# Patient Record
Sex: Female | Born: 1980 | Race: White | Hispanic: No | Marital: Married | Smoking: Former smoker
Health system: Southern US, Community
[De-identification: ages and names within clinical notes are randomized; demographics above are authoritative.]

## PROBLEM LIST (undated history)

## (undated) DIAGNOSIS — N289 Disorder of kidney and ureter, unspecified: Secondary | ICD-10-CM

## (undated) DIAGNOSIS — N200 Calculus of kidney: Secondary | ICD-10-CM

## (undated) DIAGNOSIS — F419 Anxiety disorder, unspecified: Secondary | ICD-10-CM

## (undated) DIAGNOSIS — Z87440 Personal history of urinary (tract) infections: Secondary | ICD-10-CM

## (undated) DIAGNOSIS — G43909 Migraine, unspecified, not intractable, without status migrainosus: Secondary | ICD-10-CM

## (undated) HISTORY — PX: NEPHROSTOMY: SHX1014

## (undated) HISTORY — DX: Calculus of kidney: N20.0

## (undated) HISTORY — DX: Migraine, unspecified, not intractable, without status migrainosus: G43.909

---
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
## 1999-08-05 ENCOUNTER — Emergency Department (HOSPITAL_COMMUNITY): Admission: EM | Admit: 1999-08-05 | Discharge: 1999-08-05 | Payer: Self-pay | Admitting: Emergency Medicine

## 1999-08-05 ENCOUNTER — Encounter: Payer: Self-pay | Admitting: Emergency Medicine

## 2002-11-27 ENCOUNTER — Emergency Department (HOSPITAL_COMMUNITY): Admission: EM | Admit: 2002-11-27 | Discharge: 2002-11-27 | Payer: Self-pay | Admitting: Emergency Medicine

## 2002-12-26 ENCOUNTER — Emergency Department (HOSPITAL_COMMUNITY): Admission: EM | Admit: 2002-12-26 | Discharge: 2002-12-26 | Payer: Self-pay

## 2003-06-20 ENCOUNTER — Emergency Department (HOSPITAL_COMMUNITY): Admission: EM | Admit: 2003-06-20 | Discharge: 2003-06-20 | Payer: Self-pay | Admitting: Emergency Medicine

## 2003-06-20 ENCOUNTER — Encounter: Payer: Self-pay | Admitting: Emergency Medicine

## 2003-12-13 ENCOUNTER — Other Ambulatory Visit: Admission: RE | Admit: 2003-12-13 | Discharge: 2003-12-13 | Payer: Self-pay | Admitting: Obstetrics and Gynecology

## 2005-01-22 ENCOUNTER — Other Ambulatory Visit: Admission: RE | Admit: 2005-01-22 | Discharge: 2005-01-22 | Payer: Self-pay | Admitting: Obstetrics and Gynecology

## 2005-05-21 ENCOUNTER — Inpatient Hospital Stay (HOSPITAL_COMMUNITY): Admission: AD | Admit: 2005-05-21 | Discharge: 2005-05-21 | Payer: Self-pay | Admitting: Obstetrics and Gynecology

## 2005-05-22 ENCOUNTER — Inpatient Hospital Stay (HOSPITAL_COMMUNITY): Admission: AD | Admit: 2005-05-22 | Discharge: 2005-05-22 | Payer: Self-pay | Admitting: Obstetrics and Gynecology

## 2005-07-11 ENCOUNTER — Inpatient Hospital Stay (HOSPITAL_COMMUNITY): Admission: AD | Admit: 2005-07-11 | Discharge: 2005-07-12 | Payer: Self-pay | Admitting: Obstetrics & Gynecology

## 2005-07-15 ENCOUNTER — Inpatient Hospital Stay (HOSPITAL_COMMUNITY): Admission: AD | Admit: 2005-07-15 | Discharge: 2005-07-15 | Payer: Self-pay | Admitting: Obstetrics and Gynecology

## 2005-07-24 ENCOUNTER — Inpatient Hospital Stay (HOSPITAL_COMMUNITY): Admission: AD | Admit: 2005-07-24 | Discharge: 2005-07-26 | Payer: Self-pay | Admitting: Obstetrics and Gynecology

## 2005-07-27 ENCOUNTER — Encounter: Admission: RE | Admit: 2005-07-27 | Discharge: 2005-08-06 | Payer: Self-pay | Admitting: Obstetrics and Gynecology

## 2007-11-26 ENCOUNTER — Inpatient Hospital Stay (HOSPITAL_COMMUNITY): Admission: AD | Admit: 2007-11-26 | Discharge: 2007-11-26 | Payer: Self-pay | Admitting: Obstetrics and Gynecology

## 2007-12-31 ENCOUNTER — Ambulatory Visit (HOSPITAL_COMMUNITY): Admission: RE | Admit: 2007-12-31 | Discharge: 2008-01-01 | Payer: Self-pay | Admitting: Urology

## 2007-12-31 ENCOUNTER — Encounter: Payer: Self-pay | Admitting: Urology

## 2008-01-11 ENCOUNTER — Inpatient Hospital Stay (HOSPITAL_COMMUNITY): Admission: AD | Admit: 2008-01-11 | Discharge: 2008-01-11 | Payer: Self-pay | Admitting: *Deleted

## 2008-01-12 ENCOUNTER — Observation Stay (HOSPITAL_COMMUNITY): Admission: AD | Admit: 2008-01-12 | Discharge: 2008-01-13 | Payer: Self-pay | Admitting: Obstetrics

## 2008-01-28 ENCOUNTER — Ambulatory Visit (HOSPITAL_COMMUNITY): Admission: RE | Admit: 2008-01-28 | Discharge: 2008-01-28 | Payer: Self-pay | Admitting: Urology

## 2008-01-29 ENCOUNTER — Inpatient Hospital Stay (HOSPITAL_COMMUNITY): Admission: AD | Admit: 2008-01-29 | Discharge: 2008-01-29 | Payer: Self-pay | Admitting: Obstetrics & Gynecology

## 2008-02-08 ENCOUNTER — Ambulatory Visit (HOSPITAL_COMMUNITY): Admission: RE | Admit: 2008-02-08 | Discharge: 2008-02-08 | Payer: Self-pay | Admitting: Obstetrics & Gynecology

## 2008-03-03 ENCOUNTER — Ambulatory Visit (HOSPITAL_COMMUNITY): Admission: RE | Admit: 2008-03-03 | Discharge: 2008-03-03 | Payer: Self-pay | Admitting: Interventional Radiology

## 2008-03-12 ENCOUNTER — Inpatient Hospital Stay (HOSPITAL_COMMUNITY): Admission: AD | Admit: 2008-03-12 | Discharge: 2008-03-14 | Payer: Self-pay | Admitting: Obstetrics and Gynecology

## 2008-03-15 ENCOUNTER — Encounter: Admission: RE | Admit: 2008-03-15 | Discharge: 2008-04-01 | Payer: Self-pay | Admitting: Obstetrics and Gynecology

## 2008-03-21 ENCOUNTER — Ambulatory Visit (HOSPITAL_COMMUNITY): Admission: RE | Admit: 2008-03-21 | Discharge: 2008-03-21 | Payer: Self-pay | Admitting: Interventional Radiology

## 2009-04-06 IMAGING — XA IR NEPHROSTOGRAM*R*
1 series · 12 of 24 positions shown · non-contrast
Comparison: none

EXAMINATION:

NEPHROSTOGRAM THROUGH EXISTING RIGHT NEPHROSTOMY CATHETER
FLUOROSCOPIC REMOVAL OF THE RIGHT NEPHROSTOMY CATHETER
CLINICAL DATA: 27-year-old female 9 days postpartum with a chronic
indwelling right nephrostomy during the third trimester for
hydronephrosis and flank pain

[Series 1: run · 12 of 28 slices shown]
[im 2/28]
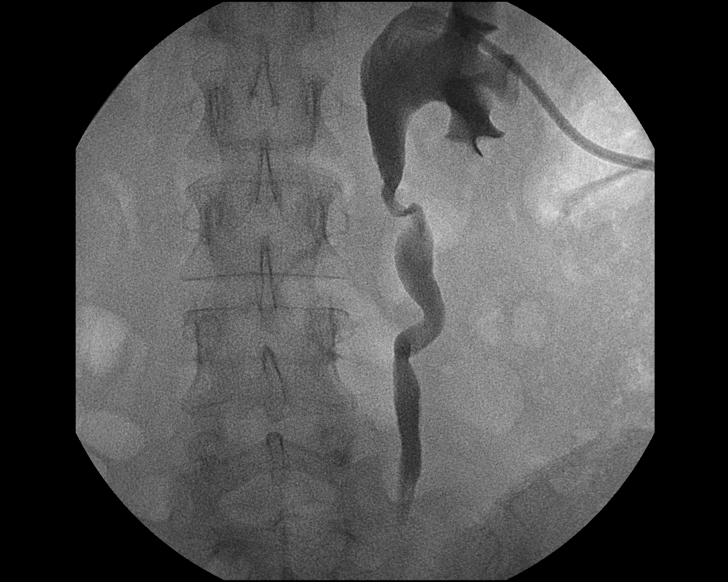
[im 4/28]
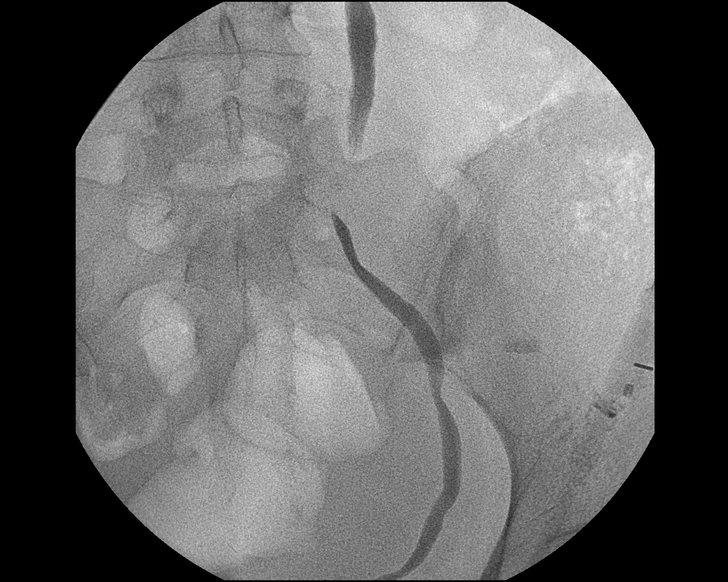
[im 6/28]
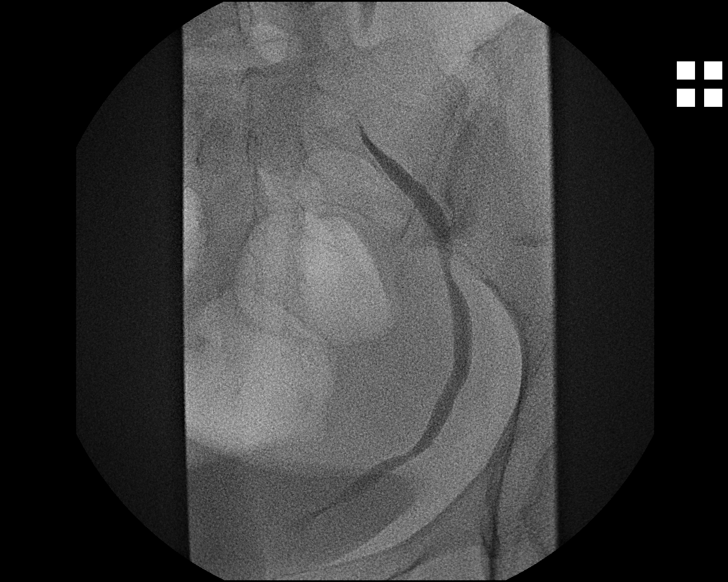
[im 9/28]
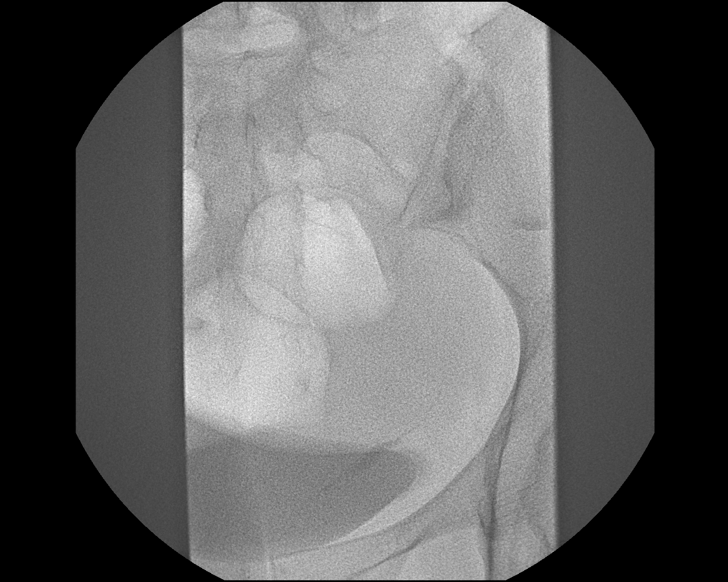
[im 11/28]
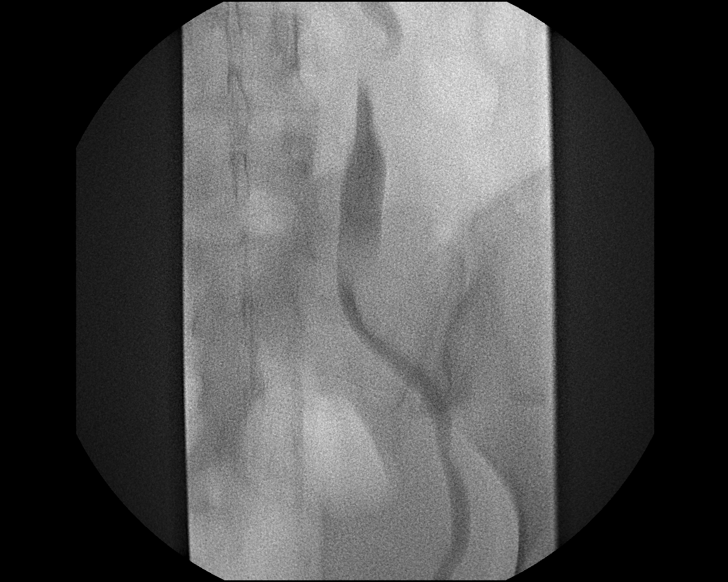
[im 13/28]
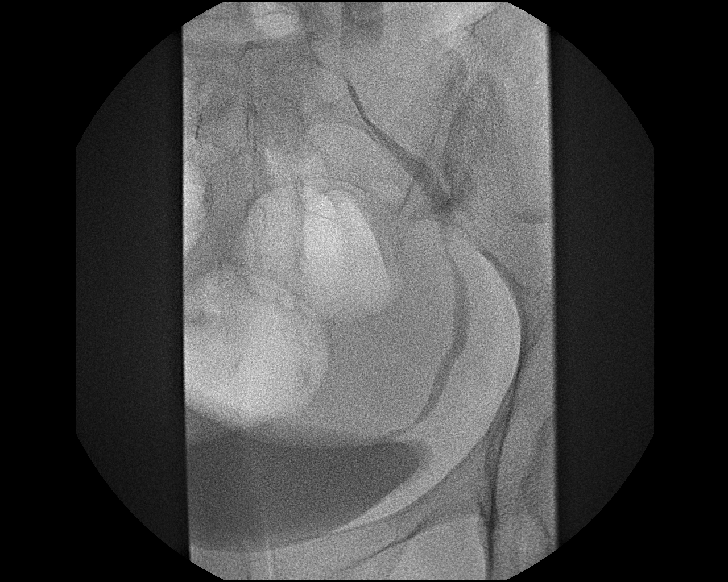
[im 16/28]
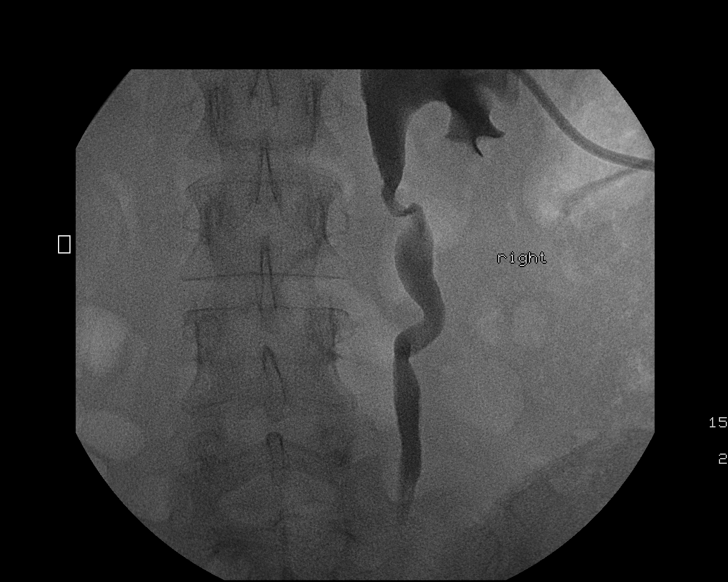
[im 18/28]
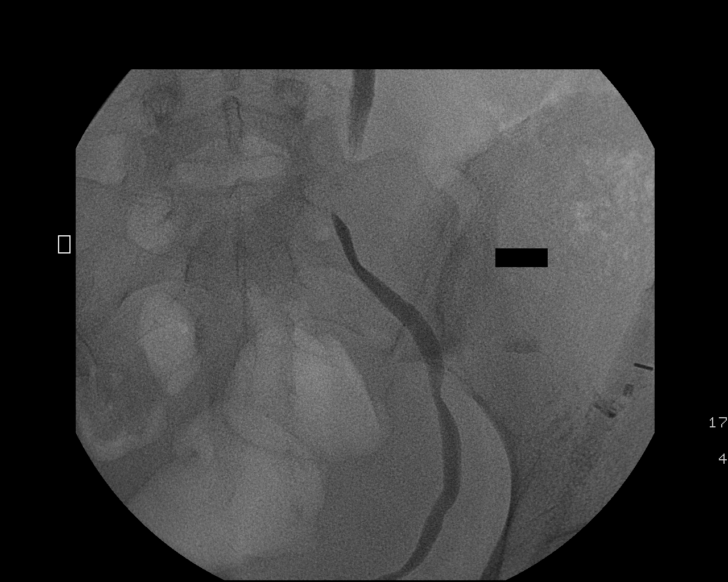
[im 20/28]
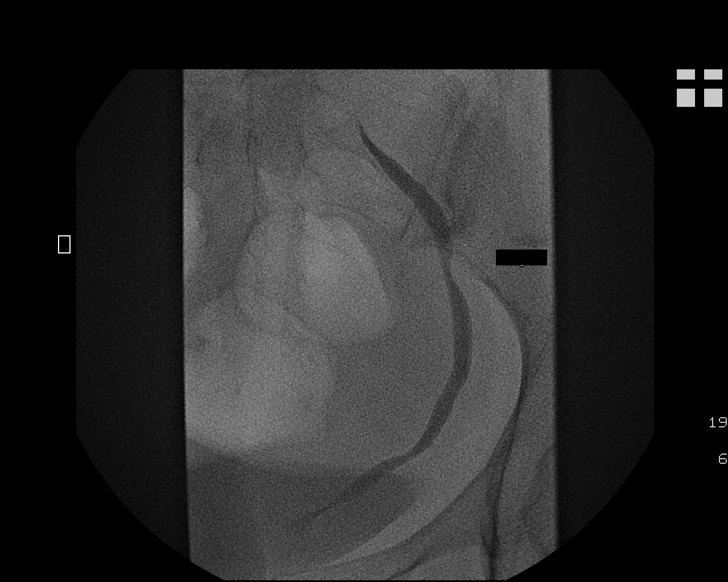
[im 23/28]
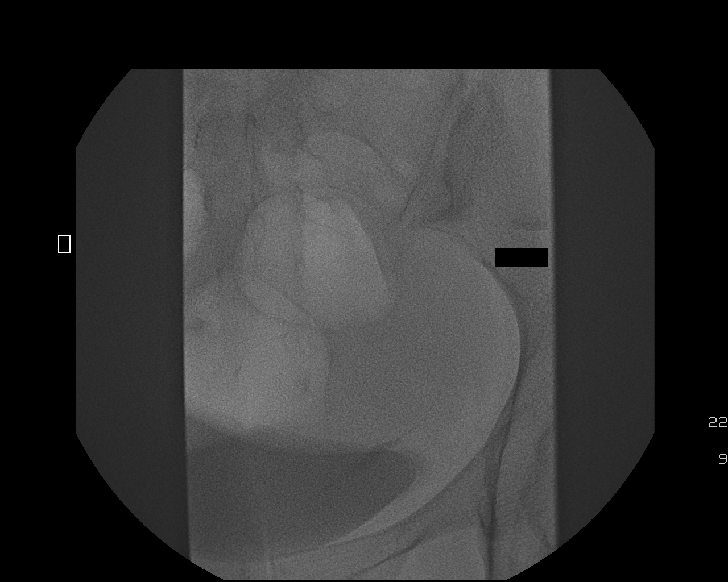
[im 25/28]
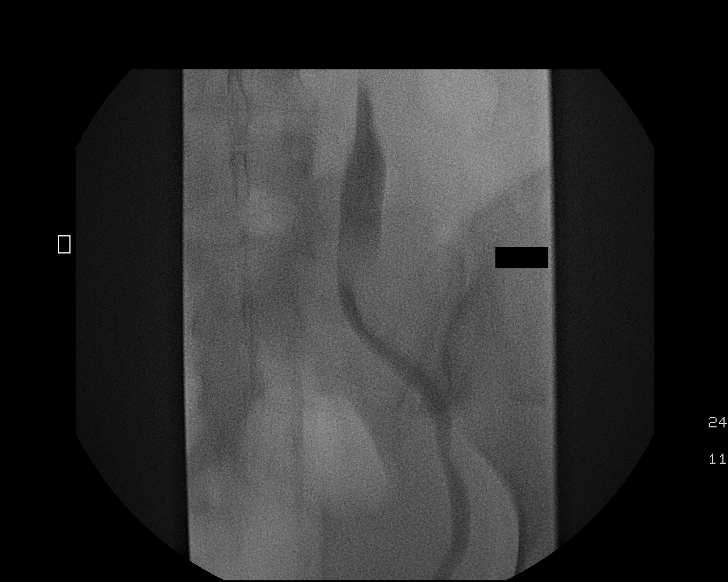
[im 28/28]
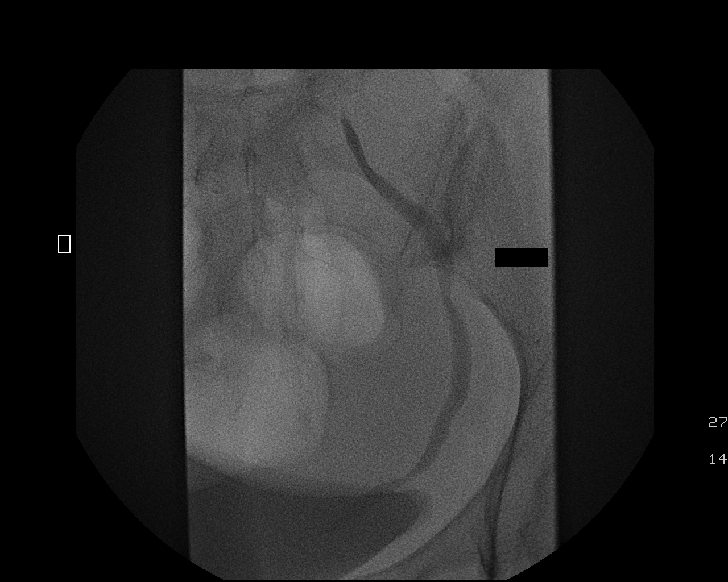

[12 of 24 positions shown; findings below may reference images not displayed]

Date:03/21/2008 [DATE]

Radiologist:GENET.Barral, M.D.

Medications:None.

Guidance:Fluoroscopic

Fluoroscopy time:4 minutes

Sedation time:None.

Contrast volume:30 ml 5mnipaque-GBB

Complications:No immediate

PROCEDURE/FINDINGS:

Informed consent was obtained from the patient following
explanation of the procedure, risks, benefits and alternatives.
The patient understands, agrees and consents for the procedure.
All questions were addressed.  A time out was performed.

Under sterile conditions, the existing nephrostomy catheter was
injected under fluoroscopy for an antegrade nephrostogram.

Nephrostogram:  This demonstrates mild hydronephrosis and proximal
hydroureter.  This extends to the pelvic level.  With further
injection of contrast to distend the collecting system and ureter,
there is passage of contrast into the pelvic ureter which is
nondilated.  Contrast flows freely into the bladder.

With intermittent fluoroscopy,  the right collecting system
continues to demonstrate good spontaneous drainage into the bladder
with the patient supine.  Images were obtained for documentation.
Therefore, the right nephrostomy catheter will be removed.

Right nephrostomy removal:  Under sterile conditions, the right
nephrostomy catheter was removed over a glide wire without
difficulty.  No immediate complication.  The patient tolerated the
procedure well.
IMPRESSION: Antegrade nephrostogram confirms patency of the right
ureter with good drainage into the bladder.

Uncomplicated right nephrostomy removal.

## 2011-01-21 NOTE — Op Note (Signed)
NAMEKHAILEE, MICK                ACCOUNT NO.:  [id number]   MEDICAL RECORD NO.:  [id number]          PATIENT TYPE:  OIB   LOCATION:  9158                          FACILITY:  [facility]   PHYSICIAN:  Valetta Fuller, M.D.  DATE OF BIRTH:  1980/09/18   DATE OF PROCEDURE:  12/31/2007  DATE OF DISCHARGE:                               OPERATIVE REPORT   PREOPERATIVE DIAGNOSIS:  Severe right hydronephrosis with ongoing right  flank discomfort.   POSTOPERATIVE DIAGNOSIS:  Severe right hydronephrosis with ongoing right  flank discomfort.   PROCEDURE PERFORMED:  Cystoscopy with right retrograde pyelogram and  attempted right double-J stent placement.   SURGEON:  Valetta Fuller, M.D.   ANESTHESIA:  IV sedation.   INDICATIONS:  Ms. Trzcinski is currently 25-[redacted] weeks pregnant.  She has been  seen several times over the past 4-6 weeks because of some significant  right flank pain.  She was originally assessed about a month ago.  She  had developed severe right flank discomfort.  CT was ordered by her  OB/GYN, Dr. Maxie Better.  This showed marked/severe  hydroureteronephrosis on the right side.  No evidence of any ureteral  calculus.  This was felt to be secondary to an extrinsic compression of  the ureter between the uterus and psoas muscle.  We felt it would be  prudent to try to manage her conservatively.  She has been in the office  several times and has had serial ultrasounds, which have shown continued  severe hydronephrosis.  She has continued to take pain medication on a  regular basis and recently her pain seemed to intensify.  We have talked  her several times about the pros and cons of elective stent placement  and eventually decided to go ahead to see if this would help alleviate  some of her symptoms and, hopefully, allow her some better relief and  the ability to get off some of the pain medication.  This was discussed  with Dr. Cherly Hensen.  She was set up to have this at [facility] where  she and the baby could be carefully monitored and kept overnight for  observation.  We had spoken her on numerous occasions about the pros and  cons, potential risks and benefits of this type of procedure.  We also  had a discussion with her this morning and discussed her anesthetic  options.  She has given full informed consent.   TECHNIQUE AND FINDINGS:  The patient was brought to the operating room.  She received IV Ancef.  She received intravenous sedation and was placed  in lithotomy position and prepped and draped in the usual manner.  Lidocaine jelly was instilled per urethra and cystoscopy performed.  The  bladder itself was unremarkable.  An 8-French cone-tip catheter was used  to perform a retrograde pyelogram.  One could see a markedly dilated  ureter from essentially just above the bladder all the way up to the  renal pelvis.  Proximally her ureter was markedly tortuous with multiple  greater than 180-degree angles to an extremely dilated renal pelvis.  We  tried to minimize fluoro as much as possible but it was difficult to  negotiate a guidewire.  Eventually we were able to get a guidewire into  what we felt was the renal pelvis.  A 6-French, 26-cm double-J stent was  placed over the wire.  Unfortunately, given the tortuosity of the ureter  we were unable to get the more proximal end into the renal pelvis.  That  stent was removed.  I then made multiple attempts to re-access the  collecting system and was unable to do so.  I was quite concerned about  limiting fluoro use.  We felt that given the marked tortuosity the  ureter and the acuteness of the angles, it was just going to be  impossible to a stent up from below.  I was also concerned that even if  I could get a wire up to the renal pelvis that we may not have a stand  of adequate length to really bridge the entire ureter given the marked  tortuosity.  We felt at some point that it was just prudent to  end the  procedure and consider percutaneous  nephrostomy tube as a better maneuver.  For that reason, the procedure  was terminated.  The patient appeared to be stable, had no obvious  complications or problems, and was brought to PACU in stable condition  without obvious problems.           ______________________________  Valetta Fuller, M.D.  Electronically Signed     DSG/MEDQ  D:  12/31/2007  T:  12/31/2007  Job:  [id number]

## 2011-01-24 NOTE — H&P (Signed)
[name], [name]                ACCOUNT NO.:  [id number]   MEDICAL RECORD NO.:  [id number]          PATIENT TYPE:  OBV   LOCATION:  9145                          FACILITY:  [facility]   PHYSICIAN:  Lenoard Aden, M.D.DATE OF BIRTH:  Dec 22, 1980   DATE OF ADMISSION:  07/10/2005  DATE OF DISCHARGE:                                HISTORY & PHYSICAL   CHIEF COMPLAINT:  Right flank pain.   HISTORY OF PRESENT ILLNESS:  She is a 30 year old white female G1, P0, EDD  August 15, 2005, at [redacted] weeks gestation who presents with onset of right  flank pain. She was seen in the emergency room. Consistent with hematuria or  hydronephrosis. Admitted for expectant management of probable kidney stone.   PAST MEDICAL HISTORY:  1.  Remarkable for asthma, stable, on no medications.  2.  History of depression, previously on Wellbutrin.  3.  History of migraines.   ALLERGIES:  PENICILLIN.   FAMILY HISTORY:  Heart disease, emphysema and stroke.   MEDICATIONS:  Prenatal vitamins.   Prenatal course was complicated by preterm labor with negative fetal  fibronectin. She has had preterm surgical change and has been given  betamethasone, otherwise pregnancy course uncomplicated.   PRENATAL LAB DATA:  Blood type B positive. Rh negative. Rubella immune.  Hepatitis, HIV negative.   PHYSICAL EXAMINATION:  GENERAL:  She is a well-developed, well-nourished  white female in no acute distress.  VITAL SIGNS:  Stable. Patient afebrile.  LUNGS:  Clear.  HEART:  Regular rate and rhythm.  ABDOMEN:  Soft, gravid, nontender. Positive right flank tenderness noted. No  CVA tenderness.  CERVICAL EXAM:  Is deferred.  EXTREMITIES:  No cords.  NEUROLOGIC EXAM:  Nonfocal. NST is reactive, no active contractions noted.   Urinalysis with large hematoma.   IMPRESSION:  Probable urolithiasis with secondary moderate to severe  hydronephrosis on the right noted by renal ultrasound.   PLAN:  Admit, IV fluids, IV Dilaudid, PCA  started. Urology consult.      Lenoard Aden, M.D.  Electronically Signed     RJT/MEDQ  D:  07/11/2005  T:  07/11/2005  Job:  [id number]

## 2011-01-24 NOTE — H&P (Signed)
[name], SCHRINER                ACCOUNT NO.:  [id number]   MEDICAL RECORD NO.:  [id number]          PATIENT TYPE:  INP   LOCATION:  9174                          [facility]ILITY:  [facility]   PHYSICIAN:  Lenoard Aden, M.D.DATE OF BIRTH:  Sep 21, 1980   DATE OF ADMISSION:  07/24/2005  DATE OF DISCHARGE:                                HISTORY & PHYSICAL   CHIEF COMPLAINT:  Spontaneous rupture of membranes in spontaneous labor.   HISTORY OF PRESENT ILLNESS:  She is a 30 year old white female G1, P0, EDD  August 14, 2005 at [redacted] weeks gestation with spontaneous rupture of membranes  at 8 a.m.  She has allergies to PENICILLIN.   MEDICATIONS:  Prenatal vitamins.   PAST MEDICAL HISTORY:  She has a history of a LEEP.  She has a history of  migraine headaches.  She has a history of depression.  She discontinued her  Wellbutrin with positive pregnancy test.  She has a history of mild asthma.  She has a family history of heart disease and breast cancer.  Prenatal  course complicated by preterm labor and presumptive kidney stone with severe  hydronephrosis seen by urology.  GBS is negative.  Prenatal laboratory data  otherwise unremarkable.   PHYSICAL EXAMINATION:  GENERAL:  She is a well-developed, well-nourished  white female in no acute distress.  HEENT:  Normal.  LUNGS:  Clear.  HEART:  Regular rate and rhythm.  ABDOMEN:  Soft, gravid, nontender.  PELVIC:  Cervix is 4 cm, 100%, vertex, +1, posterior.  EXTREMITIES:  No cords.  NEUROLOGIC:  Nonfocal.   IMPRESSION:  Term intrauterine pregnancy with spontaneous rupture of  membranes in early labor.   PLAN:  Proceed with Pitocin augmentation.  Anticipate attempts at vaginal  delivery.      Lenoard Aden, M.D.  Electronically Signed     RJT/MEDQ  D:  07/24/2005  T:  07/24/2005  Job:  [id number]

## 2011-01-24 NOTE — Consult Note (Signed)
[name], [name]                ACCOUNT NO.:  [id number]   MEDICAL RECORD NO.:  [id number]          PATIENT TYPE:  MAT   LOCATION:  MATC                          [facility]ILITY:  [facility]   PHYSICIAN:  Lenoard Aden, M.D.DATE OF BIRTH:  [birth year]   DATE OF CONSULTATION:  07/15/2005  DATE OF DISCHARGE:                                   CONSULTATION   CHIEF COMPLAINT:  Contractions and back pain.   HISTORY OF PRESENT ILLNESS:  She is a 30 year old white female, G1, P0, EDD  August 14, 2005, at 35-5/7 weeks with increased frequency of contractions  today.  Of note, the patient has a known right kidney stone with  hydronephrosis currently on Percocet for pain, previously evaluated by  urology.  She is allergic to PENICILLIN.   MEDICATIONS:  Percocet and prenatal vitamins.   PAST MEDICAL HISTORY:  Noncontributory.   PAST SURGICAL HISTORY:  Remarkable for a LEEP.  She does have a history of  mild asthma using an Advair inhaler as needed.  Also has a history of  migraine headaches and a history of depression.  She has a family history of  breast cancer and myocardial infarction.   PRENATAL LABORATORY DATA:  Blood type B positive.  Rh antibody negative,  rubella immune, hepatitis and HIV negative.   PHYSICAL EXAMINATION:  GENERAL:  She is an uncomfortable-appearing white  female in no acute distress.  HEENT:  Normal.  LUNGS:  Clear.  HEART:  Regular rhythm.  ABDOMEN:  Soft, gravid, nontender.  No CVA tenderness.  Positive right flank  pain noted.  EXTREMITIES:  Feel no cords.  NEUROLOGIC:  Nonfocal.  CERVIX:  2-3 cm, 90% effaced, vertex, +1 station, and posterior.   IMPRESSION:  1.  A 35-5/7 weeks' intrauterine pregnancy.  2.  Right hydronephrosis with probable urolithiasis.  3.  Prodromal labor pattern.   PLAN:  We will manage expectantly, monitor, start IV fluids, give Stadol IV  for pain from kidney infection.  We will not tocolyse.  Anticipate attempts  at vaginal delivery  should she go into spontaneous labor.  Otherwise, will  discharge home.      Lenoard Aden, M.D.  Electronically Signed     RJT/MEDQ  D:  07/15/2005  T:  07/15/2005  Job:  [id number]

## 2011-01-24 NOTE — Consult Note (Signed)
[name], [name]                ACCOUNT NO.:  [id number]   MEDICAL RECORD NO.:  [id number]          PATIENT TYPE:  OBV   LOCATION:  9145                          [facility]ILITY:  [facility]   PHYSICIAN:  Valetta Fuller, M.D.  DATE OF BIRTH:  1981/08/14   DATE OF CONSULTATION:  07/12/2005  DATE OF DISCHARGE:                                   CONSULTATION   REASON FOR CONSULTATION:  Right flank pain with microhematuria and possible  ureteral calculus.   HISTORY OF PRESENT ILLNESS:  [name] is a 30 year old female, who is  approximately [redacted] weeks along on her first pregnancy.  Approximately 36-48  hours ago, she developed the sudden onset of significant right-sided flank  and abdominal discomfort.  She was seen in the [facility] Emergency  Room.  At that time, urinalysis showed no pyuria, but there was significant  microhematuria.  A renal ultrasound showed what appeared to be moderate to  severe hydronephrosis on the right, along with some left-sided  hydronephrosis.  However, they did feel as though they could see a ureteral  jet on the left but not on the right, and she was admitted presumptively  with a probable ureteral calculus for pain control.  She did receive  Dilaudid.  I spoke with Dr. Billy Coast, her managing physician, last evening and  suggested that try her off the parenteral analgesics and see how she did  with oral agents.  We are now in to see how she is doing.  At this point,  her pain is relatively minimal.  Her last oral narcotic was approximately 4-  5 hours ago, and she just had some very minimal soreness on the right side.  She has been straining her urine, and nothing obvious has come out.  She has  had no other significant change in voiding.  She has no nausea or vomiting  and has had no fever.  Of note, she was put on prophylactic dose antibiotics  just for prevention of other problems.  She has no personal history of  nephrolithiasis, although there is a family  history.   PAST MEDICAL HISTORY:  1.  Notable for asthma.  2.  She also has a history of depression and migraine headaches.  3.  She does have an allergy to PENICILLIN.   FAMILY HISTORY:  Notable for heart disease.   MEDICATIONS:  Perinatal vitamins.   On exam, she is a well-developed, well-nourished female, was afebrile with  normal vital signs.  She was in no acute distress.  No additional exam was  done at this time.   DATA:  Urinalysis with microhematuria.  Renal ultrasound as above.   ASSESSMENT:  Right-sided abdominal and flank pain with microhematuria.  Certainly, given this scenario along with the sudden onset of her pain, I  think a ureteral calculus is highly likely.  At this point, she seems to be  doing well clinically and does not appear to have any obvious indication for  any acute intervention.  Hopefully, she will pass the stone spontaneously  and needs to be encouraged to increase  fluid intake and strain her urine.  If her pain intensifies and she again requires additional parenteral  narcotic, then a 1 shot IVP would be quite reasonable to determine the size  and location of the stone if at all possible.  Obviously double-J stent  placement is a possibility, but we would like to avoid that unless that is  absolutely necessary.  We will discuss things further with Dr. Billy Coast  and continue to monitor as long as she remains an inpatient.  If she does  well clinically, then we will obviously want to follow up with her as well  and how soon that is depends on how her clinical course goes.  We spent  about 30 minutes going over her situation.           ______________________________  Valetta Fuller, M.D.  Electronically Signed     DSG/MEDQ  D:  07/12/2005  T:  07/12/2005  Job:  [id number]   cc:   [name], M.D.  Fax: [phone]

## 2011-06-02 LAB — CBC
HCT: 35 — ABNORMAL LOW
Hemoglobin: 12.2
MCHC: 35
MCV: 100.6 — ABNORMAL HIGH
RBC: 3.48 — ABNORMAL LOW

## 2011-06-02 LAB — DIFFERENTIAL
Basophils Relative: 0
Eosinophils Absolute: 0
Eosinophils Relative: 0
Monocytes Absolute: 0.5
Monocytes Relative: 4

## 2011-06-03 LAB — CBC
Hemoglobin: 11.8 — ABNORMAL LOW
MCHC: 34.9
RDW: 13.4

## 2011-06-05 LAB — CBC
HCT: 30.6 — ABNORMAL LOW
HCT: 41.2
Hemoglobin: 10.5 — ABNORMAL LOW
MCHC: 34.3
MCV: 100.8 — ABNORMAL HIGH
MCV: 102.8 — ABNORMAL HIGH
MCV: 102.9 — ABNORMAL HIGH
Platelets: 203
Platelets: 247
RBC: 4.01
RDW: 13.6
RDW: 13.7
WBC: 11.6 — ABNORMAL HIGH
WBC: 15 — ABNORMAL HIGH

## 2011-06-05 LAB — COMPREHENSIVE METABOLIC PANEL
AST: 21
Albumin: 2.9 — ABNORMAL LOW
Calcium: 9.1
Chloride: 110
Creatinine, Ser: 0.45
GFR calc Af Amer: >60
Total Bilirubin: 0.5

## 2011-06-05 LAB — BASIC METABOLIC PANEL
BUN: 7
Calcium: 9.2
GFR calc non Af Amer: >60
Glucose, Bld: 86

## 2011-06-05 LAB — PROTIME-INR: INR: 0.9

## 2011-11-23 ENCOUNTER — Emergency Department (HOSPITAL_COMMUNITY): Payer: 59

## 2011-11-23 ENCOUNTER — Encounter (HOSPITAL_COMMUNITY): Payer: Self-pay | Admitting: Emergency Medicine

## 2011-11-23 ENCOUNTER — Emergency Department (HOSPITAL_COMMUNITY)
Admission: EM | Admit: 2011-11-23 | Discharge: 2011-11-23 | Disposition: A | Discharge status: Home / Self Care | Payer: 59 | Attending: Emergency Medicine | Admitting: Emergency Medicine

## 2011-11-23 DIAGNOSIS — F29 Unspecified psychosis not due to a substance or known physiological condition: Secondary | ICD-10-CM | POA: Insufficient documentation

## 2011-11-23 DIAGNOSIS — R209 Unspecified disturbances of skin sensation: Secondary | ICD-10-CM | POA: Insufficient documentation

## 2011-11-23 DIAGNOSIS — Z79899 Other long term (current) drug therapy: Secondary | ICD-10-CM | POA: Insufficient documentation

## 2011-11-23 DIAGNOSIS — R4182 Altered mental status, unspecified: Secondary | ICD-10-CM | POA: Insufficient documentation

## 2011-11-23 HISTORY — DX: Disorder of kidney and ureter, unspecified: N28.9

## 2011-11-23 HISTORY — DX: Anxiety disorder, unspecified: F41.9

## 2011-11-23 HISTORY — DX: Calculus of kidney: N20.0

## 2011-11-23 LAB — COMPREHENSIVE METABOLIC PANEL
ALT: 14 U/L (ref 0–35)
Alkaline Phosphatase: 29 U/L — ABNORMAL LOW (ref 39–117)
BUN: 11 mg/dL (ref 6–23)
CO2: 26 mEq/L (ref 19–32)
Calcium: 9.3 mg/dL (ref 8.4–10.5)
GFR calc Af Amer: >90 mL/min (ref >90)
GFR calc non Af Amer: >90 mL/min (ref >90)
Glucose, Bld: 97 mg/dL (ref 70–99)
Sodium: 137 mEq/L (ref 135–145)

## 2011-11-23 LAB — CBC
HCT: 38.2 % (ref 36.0–46.0)
Hemoglobin: 13 g/dL (ref 12.0–15.0)
MCV: 97.7 fL (ref 78.0–100.0)
Platelets: 198 10*3/uL (ref 150–400)
RBC: 3.91 MIL/uL (ref 3.87–5.11)
WBC: 6.1 10*3/uL (ref 4.0–10.5)

## 2011-11-23 LAB — APTT: aPTT: 28 seconds (ref 24–37)

## 2011-11-23 LAB — DIFFERENTIAL
Eosinophils Relative: 0 % (ref 0–5)
Lymphocytes Relative: 37 % (ref 12–46)
Lymphs Abs: 2.2 10*3/uL (ref 0.7–4.0)
Monocytes Relative: 8 % (ref 3–12)

## 2011-11-23 LAB — URINALYSIS, ROUTINE W REFLEX MICROSCOPIC
Leukocytes, UA: NEGATIVE
Protein, ur: NEGATIVE mg/dL
Urobilinogen, UA: 0.2 mg/dL (ref 0.0–1.0)

## 2011-11-23 LAB — RAPID URINE DRUG SCREEN, HOSP PERFORMED
Barbiturates: NOT DETECTED
Opiates: POSITIVE — AB
Tetrahydrocannabinol: NOT DETECTED

## 2011-11-23 NOTE — ED Notes (Signed)
Pt. Alert and oriented, NAD noted, pt. Discharged to home with husband via w/c

## 2011-11-23 NOTE — ED Notes (Signed)
Pt. Alert and oriented, speech slow, NAD noted, husband  States  pt. Slow to respond but oriented,

## 2011-11-23 NOTE — ED Notes (Signed)
Pt. Out drinking with her husband last evening.  Husband noticed confusion beginning at 0230.  Pt. Felt numbness today and a "rush" to her head.  Pt. Takes wellbutrin and Xanax but denies taking more medication than usual.  Pt. Alert, verbal now with some difficulty expressing herself.

## 2011-11-23 NOTE — ED Notes (Signed)
Pt distant in conversation, husband states they went out last night had a few drinks, pt got up in night, got a glass of water then asked husbad, Who brought there water in here?' pt also states she had a bad headache for about 10 minutes, pt is very slow to respond, has difficulty following commands.

## 2011-11-23 NOTE — ED Provider Notes (Signed)
History     CSN: [id number]  Arrival date & time 11/23/11  1609   First MD Initiated Contact with Patient 11/23/11 1619      Chief Complaint  Patient presents with  . Altered Mental Status    (Consider location/radiation/quality/duration/timing/severity/associated sxs/prior treatment) HPI Pt has history of depression on wellbutrin and xanax went to concert last night with mod to heavy alcohol consumption and has been acting bizarre since. Pt husband states she was staring into space last night and slow to respond. This morning pt had "rush" to head and left sided numbness. Unclear as to timing. Pt states upon waking and husband thinks was closer to noon. Numbness has since resolved. Husband notes that pt has had difficulty formulating sentences and slow to follow commands. Denies any other drug intake  Past Medical History  Diagnosis Date  . Renal disorder   . Anxiety   . Kidney stones     Past Surgical History  Procedure Date  . Nephrostomy     No family history on file.  History  Substance Use Topics  . Smoking status: Former Games developer  . Smokeless tobacco: Not on file  . Alcohol Use: Yes    OB History    Grav Para Term Preterm Abortions TAB SAB Ect Mult Living                  Review of Systems  Constitutional: Negative for fever.  Respiratory: Negative for shortness of breath.   Cardiovascular: Negative for chest pain.  Gastrointestinal: Negative for nausea, vomiting and abdominal pain.  Neurological: Positive for numbness. Negative for weakness.    Allergies  Review of patient's allergies indicates no known allergies.  Home Medications   Current Outpatient Rx  Name Route Sig Dispense Refill  . ALPRAZOLAM 0.5 MG PO TABS Oral Take 0.5 mg by mouth 3 (three) times daily as needed. For anxiety.    . BUPROPION HCL ER (XL) 150 MG PO TB24 Oral Take 150 mg by mouth 2 (two) times daily.    Marland Kitchen HYDROCODONE-ACETAMINOPHEN 7.5-500 MG PO TABS Oral Take 0.5-1 tablets by  mouth every 6 (six) hours as needed.    Carma Leaven M PLUS PO TABS Oral Take 1 tablet by mouth daily.    Marland Kitchen PRESCRIPTION MEDICATION Topical Apply 1 application topically every other day. Retin-A Cream.    . SUMATRIPTAN SUCCINATE 50 MG PO TABS Oral Take 50 mg by mouth every 2 (two) hours as needed. For migraine headache.      BP 96/58  Pulse 71  Temp(Src) 98 F (36.7 C) (Oral)  Resp 14  Ht 5\' 5"  (1.651 m)  Wt 131 lb (59.421 kg)  BMI 21.80 kg/m2  SpO2 100%  LMP 11/09/2011  Physical Exam  Nursing note and vitals reviewed. Constitutional: She is oriented to person, place, and time. She appears well-developed and well-nourished. No distress.  HENT:  Head: Normocephalic and atraumatic.  Mouth/Throat: Oropharynx is clear and moist.  Eyes: EOM are normal. Pupils are equal, round, and reactive to light.  Neck: Normal range of motion. Neck supple.       No meningismus   Cardiovascular: Normal rate and regular rhythm.   Pulmonary/Chest: Effort normal and breath sounds normal. No respiratory distress. She has no wheezes. She has no rales.  Abdominal: Soft. Bowel sounds are normal. She exhibits no mass. There is no tenderness. There is no rebound and no guarding.  Musculoskeletal: Normal range of motion. She exhibits no edema and no tenderness.  Neurological: She is alert and oriented to person, place, and time. No cranial nerve deficit. Coordination normal.       Pt has trouble formulating sentences and at times is confused. She stares blankly without making eye contact. 5/5 motor, sensation  Skin: Skin is warm and dry. No rash noted. No erythema.    ED Course  Procedures (including critical care time)  Labs Reviewed  COMPREHENSIVE METABOLIC PANEL - Abnormal; Notable for the following:    Alkaline Phosphatase 29 (*)    All other components within normal limits  URINE RAPID DRUG SCREEN (HOSP PERFORMED) - Abnormal; Notable for the following:    Opiates POSITIVE (*)    Benzodiazepines  POSITIVE (*)    All other components within normal limits  ETHANOL  CBC  DIFFERENTIAL  PROTIME-INR  PREGNANCY, URINE  URINALYSIS, ROUTINE W REFLEX MICROSCOPIC  APTT   Ct Head Wo Contrast  11/23/2011  *RADIOLOGY REPORT*  Clinical Data: Confusion, altered mental status  CT HEAD WITHOUT CONTRAST  Technique:  Contiguous axial images were obtained from the base of the skull through the vertex without contrast.  Comparison: None.  Findings: No acute intracranial hemorrhage.  No focal mass lesion. No CT evidence of acute infarction.   No midline shift or mass effect.  No hydrocephalus.  Basilar cisterns are patent. Paranasal sinuses and mastoid air cells are clear.  Orbits are normal.  IMPRESSION: Normal headCT  Original Report Authenticated By: Genevive Bi, M.D.     1. Altered mental status   2. Polypharmacy       MDM  Discussed with Dr Thad Ranger. She suggested stopping the wellbutrin and following up as an outpatient for MRI. Discussed with husband and pt and agree with plan to go home and return for worsening symptoms or any concerns        Loren Racer, MD 11/23/11 2132

## 2011-11-23 NOTE — Discharge Instructions (Signed)
Stop taking Wellbutrin and drinking alcohol until evaluated by the neurologist. Call to make an appointment for outpatient MRI. Return immediately for worsening symptoms or any concerns

## 2011-11-28 ENCOUNTER — Telehealth: Payer: Self-pay

## 2011-11-28 NOTE — Telephone Encounter (Signed)
Dr. Laurey Morale office calling to see if we can work pt in this coming week, due to her confusion spells and arm shaking, they are ordering an MRI on her.

## 2011-12-01 NOTE — Telephone Encounter (Signed)
yes you can work her in.

## 2011-12-03 NOTE — Telephone Encounter (Signed)
Pt worked in for appt on 12/11/2011.

## 2011-12-11 ENCOUNTER — Ambulatory Visit (INDEPENDENT_AMBULATORY_CARE_PROVIDER_SITE_OTHER): Payer: 59 | Admitting: Neurology

## 2011-12-11 ENCOUNTER — Encounter: Payer: Self-pay | Admitting: Neurology

## 2011-12-11 VITALS — BP 100/68 | HR 68 | Ht 65.5 in | Wt 140.0 lb

## 2011-12-11 DIAGNOSIS — R569 Unspecified convulsions: Secondary | ICD-10-CM

## 2011-12-11 DIAGNOSIS — G43109 Migraine with aura, not intractable, without status migrainosus: Secondary | ICD-10-CM

## 2011-12-11 MED ORDER — DEXAMETHASONE 2 MG PO TABS: ORAL_TABLET | ORAL | Status: DC

## 2011-12-11 NOTE — Patient Instructions (Signed)
Your EEG is scheduled for Thursday, April 11th at 10:30am.  Please arrive to [facility], first floor admitting by 10:15am. [phone].

## 2011-12-11 NOTE — Progress Notes (Signed)
Dear Dr. Waynard Edwards,  Thank you for having me see [name] in consultation today at [facility] for her problem with episodes of confusion and headache.  As you may recall, she is a 31 y.o. year old female with a history of a migraine headaches who 2 weeks ago was noted by her husband to seem slightly confused and "out of it" after a night out.  There was no significant alcohol use or other substance use that evening.  Her husband showed me a video where she seemed slightly slow to respond and had repetitive movements of her right shoulder, almost choreiform in nature.  The next day the patient reported called her husband and seemed very confused  She commented on a previous headache that morning.  She was taken to the ED and it was felt that her confusion may have been from hydrocodone and alprazolam use.  During this time she also had left sided weakness.  Her confusion slowly resolved over several hours, but continued to have frequent headaches accompanied by slowness of thought, and difficulty speaking.  This less severe confusion has not remitted until about 4 days ago.  However, she still continues to have a severe headache.  These headaches are usually proceeded by blurriness of vision in the left eye.  They are described as holocranial, throbbing in nature.  They do not seem to worsen with position change.  She has a history of migraine headaches that started when she was in her 39s.  These occurred 1-2 times per month.  Past Medical History  Diagnosis Date  . Renal disorder   . Anxiety   . Kidney stones   . Migraines     Past Surgical History  Procedure Date  . Nephrostomy     History   Social History  . Marital Status: Married    Spouse Name: N/A    Number of Children: N/A  . Years of Education: N/A   Social History Main Topics  . Smoking status: Former Smoker    Quit date: 09/09/2003  . Smokeless tobacco: Never Used  . Alcohol Use: Yes     once a month  .  Drug Use: No  . Sexually Active: Yes    Birth Control/ Protection: Pill   Other Topics Concern  . None   Social History Narrative  . None    No family history on file.  Current Outpatient Prescriptions on File Prior to Visit  Medication Sig Dispense Refill  . ALPRAZolam (XANAX) 0.5 MG tablet Take 0.5 mg by mouth 3 (three) times daily as needed. For anxiety.      Marland Kitchen buPROPion (WELLBUTRIN XL) 150 MG 24 hr tablet Take 150 mg by mouth 2 (two) times daily.      Marland Kitchen HYDROcodone-acetaminophen (LORTAB) 7.5-500 MG per tablet Take 0.5-1 tablets by mouth every 6 (six) hours as needed.      . Multiple Vitamins-Minerals (MULTIVITAMINS THER. W/MINERALS) TABS Take 1 tablet by mouth daily.      Marland Kitchen PRESCRIPTION MEDICATION Apply 1 application topically every other day. Retin-A Cream.      . SUMAtriptan (IMITREX) 50 MG tablet Take 50 mg by mouth every 2 (two) hours as needed. For migraine headache.        No Known Allergies    ROS:  13 systems were reviewed and  are unremarkable.   Examination:  Filed Vitals:   12/11/11 1238  BP: 100/68  Pulse: 68  Height: 5' 5.5" (1.664 m)  Weight: 140 lb (63.504 kg)     In general, well appearing women.  Cardiovascular: The patient has a regular rate and rhythm and no carotid bruits.  Fundoscopy:  Disks are flat. Vessel caliber within normal limits.  Mental status:   The patient is oriented to person, place and time. Recent and remote memory are intact. Attention span and concentration are normal. Language including repetition, naming, following commands are intact. Fund of knowledge of current and historical events, as well as vocabulary are normal.  Cranial Nerves: L>R anisocoria 0.64mm difference, but both reactive. Visual fields full to confrontation. Extraocular movements are intact without nystagmus. Facial sensation and muscles of mastication are intact. Muscles of facial expression are symmetric. Hearing intact to bilateral finger rub. Tongue  protrusion, uvula, palate midline.  Shoulder shrug intact  Motor:  The patient has normal bulk and tone, no pronator drift.  She does appear to be mildly akathisic.   5/5 muscle strength bilaterally.  Reflexes:   Biceps  Triceps Brachioradialis Knee Ankle  Right 2+  2+  2+   2+ 2+  Left  2+  2+  2+   2+ 2+  Toes down  Coordination:  Normal finger to nose.  No dysdiadokinesia.  Sensation is intact to temperature and vibration.  Gait and Station are normal.  Tandem gait is intact.  Romberg is negative  MRI brain was reviewed and is unremarkable.   Impression/Recs: 1.  Confusional episodes that appear to relate to headache.  Confusion has now resolved but headache, which is migrainous, remains.  I am suspicious for confusional migraine.  I will get an EEG, although I think her description of the length of symptoms would be very unusual for seizure.  I am going to give her a Decadron taper to see if we can abort her persistent headache.  If she does not get improvement after a week she will call me.  We may need to consider a lumbar puncture for CSF analysis.    We will see the patient back in 2 months.  Thank you for having [place] see [name] in consultation.  Feel free to contact me with any questions.  Lupita Raider Modesto Charon, MD [facility], [place] [address] [place], [place] [address] Phone: [phone] Fax: [phone].

## 2011-12-12 ENCOUNTER — Telehealth: Payer: Self-pay | Admitting: Neurology

## 2011-12-12 DIAGNOSIS — G43109 Migraine with aura, not intractable, without status migrainosus: Secondary | ICD-10-CM | POA: Insufficient documentation

## 2011-12-12 NOTE — Telephone Encounter (Signed)
Received a call from the patient. She reports that she has not started the steroids (Decadron) that Dr. Modesto Charon recommended as a couple of years ago, she took steroids for some reason (she doesn't remember what it was or why she took it) and they made her "feel like a crazy person." She is wondering if there is something else Dr. Modesto Charon can suggest to break her HA cycle. She states she will take them if she has to, but would rather not if there is an alternative. I told the patient that I would check with him and we would let her know. She is in agreement with this plan. **Dr. Modesto Charon, please advise.

## 2011-12-14 NOTE — Telephone Encounter (Signed)
We can try tizanidine 4mg  bid for 7 days.  If this makes her too sleepy she can try a 1/2 tablet (2mg ) bid.

## 2011-12-15 ENCOUNTER — Other Ambulatory Visit: Payer: Self-pay | Admitting: Neurology

## 2011-12-15 MED ORDER — TIZANIDINE HCL 4 MG PO TABS: ORAL_TABLET | ORAL | Status: DC

## 2011-12-15 NOTE — Telephone Encounter (Signed)
Left a message for the patient to return my call.  

## 2011-12-15 NOTE — Telephone Encounter (Signed)
The patient returned my call. Instructions given re: Tizanidine. Aware med called to her pharmacy. Discussed what to do if it made her sleepy. The patient understands. No other concerns at this time.

## 2011-12-18 ENCOUNTER — Ambulatory Visit (HOSPITAL_COMMUNITY)
Admission: RE | Admit: 2011-12-18 | Discharge: 2011-12-18 | Disposition: A | Discharge status: Home / Self Care | Payer: 59 | Source: Ambulatory Visit | Attending: Neurology | Admitting: Neurology

## 2011-12-18 DIAGNOSIS — R51 Headache: Secondary | ICD-10-CM | POA: Insufficient documentation

## 2011-12-18 DIAGNOSIS — R569 Unspecified convulsions: Secondary | ICD-10-CM

## 2011-12-18 DIAGNOSIS — Z1389 Encounter for screening for other disorder: Secondary | ICD-10-CM | POA: Insufficient documentation

## 2011-12-22 NOTE — Procedures (Signed)
EEG NUMBER:  [id number].  This routine EEG was requested in this 31 year old woman with a history of spells of confusion.  She also has a history of severe headaches.  The EEG was done with the patient awake.  During periods of maximal wakefulness, she had an 11 cycle per second posterior dominant rhythm that attenuated with eye opening was symmetric.  Background activities were composed of low-to-medium amplitude beta activities that were simply dominant and intermixed with the overlying alpha rhythm.  These beta activities did seem to be more prominent in the temporal regions bilaterally.  Photic stimulation produced a symmetric driving response. Hyperventilation was performed for 3 minutes with good effort without markedly change in the tracing.  The patient did not fall asleep.  CLINICAL INTERPRETATION:  This routine EEG done with the patient awake is normal.          ______________________________ Denton Meek, MD    [id number] D:  12/22/2011 17:19:27  T:  12/22/2011 18:04:02  Job #:  [id number]

## 2012-01-19 ENCOUNTER — Ambulatory Visit: Payer: 59 | Admitting: Neurology

## 2012-01-30 ENCOUNTER — Encounter: Payer: Self-pay | Admitting: Neurology

## 2012-01-30 ENCOUNTER — Ambulatory Visit (INDEPENDENT_AMBULATORY_CARE_PROVIDER_SITE_OTHER): Payer: 59 | Admitting: Neurology

## 2012-01-30 VITALS — BP 100/64 | HR 62 | Wt 141.0 lb

## 2012-01-30 DIAGNOSIS — G43109 Migraine with aura, not intractable, without status migrainosus: Secondary | ICD-10-CM

## 2012-01-30 MED ORDER — AMITRIPTYLINE HCL 10 MG PO TABS: ORAL_TABLET | ORAL | Status: DC

## 2012-01-30 MED ORDER — TRAMADOL HCL 50 MG PO TABS: 50.0000 mg | ORAL_TABLET | Freq: Four times a day (QID) | ORAL | Status: AC | PRN

## 2012-01-30 NOTE — Progress Notes (Signed)
Dear Dr. Waynard Edwards,  I saw  Tressie Ellis back in [facility] for her problem with confusion headaches.  As you may recall, she is a 31 y.o. year old female with a history of migraine headaches since her 4s who had two episodes of severe confusion with her headaches, one with possible unilateral weakness.  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  Routine EEG was normal.  She had a prolonged headache when I saw her, she was reluctant to use prednisone as an abortive.  Zanaflex worked to abort her prolonged headache.  She now is having migraine headaches 2-3 times per week.  She is not using Imitrex because of my worry over possible hemiplegic migraine and stroke.  She is using Aleve and ibuprofen as abortive with little success.    She has not had any spells of severe confusion or weakness with her headaches.  She has stopped her OCP because of risk of "blood clots".  She is not planning any other children.  She does have a history of kidney stones.    Medical history, social history, and family history were reviewed and have not changed since the last clinic visit.  Current Outpatient Prescriptions on File Prior to Visit  Medication Sig Dispense Refill  . ALPRAZolam (XANAX) 0.5 MG tablet Take 0.5 mg by mouth 3 (three) times daily as needed. For anxiety.      Marland Kitchen buPROPion (WELLBUTRIN XL) 150 MG 24 hr tablet Take 150 mg by mouth 2 (two) times daily.      . Multiple Vitamins-Minerals (MULTIVITAMINS THER. W/MINERALS) TABS Take 1 tablet by mouth daily.      Marland Kitchen PRESCRIPTION MEDICATION Apply 1 application topically every other day. Retin-A Cream.      . amitriptyline (ELAVIL) 10 MG tablet start with 1 tablet at night for 1 week, then increase to 2 tablets.  60 tablet  3  . dexamethasone (DECADRON) 2 MG tablet take 2 tabs twice a day for 4 days, then 1 tab twice a day for 4 days then stop.  24 tablet  0  . HYDROcodone-acetaminophen (LORTAB) 7.5-500 MG per tablet Take 0.5-1 tablets by  mouth every 6 (six) hours as needed.      . SUMAtriptan (IMITREX) 50 MG tablet Take 50 mg by mouth every 2 (two) hours as needed. For migraine headache.      Marland Kitchen tiZANidine (ZANAFLEX) 4 MG tablet Take one (4 mg) tablet twice a day by mouth for 7 days. If it makes you too sleepy, take half a tablet (2 mg) twice a day by mouth for 7 days.  14 tablet  0    No Known Allergies  ROS:  13 systems were reviewed and  are unremarkable.  Exam: . Filed Vitals:   01/30/12 0954  BP: 100/64  Pulse: 62  Weight: 141 lb (63.957 kg)    In general, well appearing women who looks in pain.   Impression/Recommendations:  1.  Migraines - I would like to start her on a preventative - Elavil 10->20 mg qhs.  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  I have told her to stay off the Imitrex for now as the triptans are theoretically contraindicated with hemiplegic migraine.  I have given her tramadol as an abortive.  We will see the patient back in 2 months.  Lupita Raider Modesto Charon, MD [facility],  [place]

## 2012-04-08 ENCOUNTER — Ambulatory Visit: Payer: 59 | Admitting: Neurology

## 2012-04-16 ENCOUNTER — Encounter: Payer: Self-pay | Admitting: Neurology

## 2012-04-16 ENCOUNTER — Ambulatory Visit (INDEPENDENT_AMBULATORY_CARE_PROVIDER_SITE_OTHER): Payer: 59 | Admitting: Neurology

## 2012-04-16 VITALS — BP 112/66 | HR 88 | Wt 143.0 lb

## 2012-04-16 DIAGNOSIS — G43109 Migraine with aura, not intractable, without status migrainosus: Secondary | ICD-10-CM

## 2012-04-16 MED ORDER — VERAPAMIL HCL ER 120 MG PO TBCR: 120.0000 mg | EXTENDED_RELEASE_TABLET | Freq: Every day | ORAL | Status: DC

## 2012-04-18 NOTE — Progress Notes (Signed)
Dear Dr. Waynard Edwards,   I saw [name] back in [facility] for her problem with confusion headaches. As you may recall, she is a 31 y.o. year old female with a history of migraine headaches since her 81s who had two episodes of severe confusion with her headaches, one with possible unilateral weakness. 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 Routine EEG was normal. She had a prolonged headache when I saw her, she was reluctant to use prednisone as an abortive. Zanaflex worked to abort her prolonged headache.  When I last saw her she had developed migraine headaches 2-3 times per week.  I started her on Elavil 25mg  qhs and she had quiescence of her migraines.  However, it caused significant weight gain.  She also had dysphoric side effects from it.   She tried tramadol for her headaches when they come and it had some efficacy in relieving them. She is not using Imitrex due to my concerns about possible hemiplegic migraine, although I think this is probably overly cautious on my part.  She does complain infrequently of spontaneous movements of her left hands during the height of her headaches.  Medical history, social history, and family history were reviewed and have not changed since the last clinic visit.  Current Outpatient Prescriptions on File Prior to Visit  Medication Sig Dispense Refill  . ALPRAZolam (XANAX) 0.5 MG tablet Take 0.5 mg by mouth 3 (three) times daily as needed. For anxiety.      Marland Kitchen buPROPion (WELLBUTRIN XL) 150 MG 24 hr tablet Take 150 mg by mouth 2 (two) times daily.      . Multiple Vitamins-Minerals (MULTIVITAMINS THER. W/MINERALS) TABS Take 1 tablet by mouth daily.      Marland Kitchen amitriptyline (ELAVIL) 10 MG tablet start with 1 tablet at night for 1 week, then increase to 2 tablets.  60 tablet  3  . HYDROcodone-acetaminophen (LORTAB) 7.5-500 MG per tablet Take 0.5-1 tablets by mouth every 6 (six) hours as needed.      Marland Kitchen PRESCRIPTION MEDICATION Apply 1 application  topically every other day. Retin-A Cream.      . SUMAtriptan (IMITREX) 50 MG tablet Take 50 mg by mouth every 2 (two) hours as needed. For migraine headache.      Marland Kitchen tiZANidine (ZANAFLEX) 4 MG tablet Take one (4 mg) tablet twice a day by mouth for 7 days. If it makes you too sleepy, take half a tablet (2 mg) twice a day by mouth for 7 days.  14 tablet  0  . verapamil (CALAN-SR) 120 MG CR tablet Take 1 tablet (120 mg total) by mouth at bedtime.  30 tablet  3    No Known Allergies  ROS:  13 systems were reviewed and  are unremarkable.  Exam: . Filed Vitals:   04/16/12 1539  BP: 112/66  Pulse: 88  Weight: 143 lb (64.864 kg)      Impression/Recommendations:  1.  Migraine headaches - I am going to start her on verapamil SR 120mg  daily.  This can be increased as tolerated to prevent her headaches.  She will continue to use tramadol as an abortive.  In the future, I think it would be ok if you tried Imitrex again, particularly if she is not complaining of frequent headaches where she has weakness on her left side. 2.  Abnormal movements of left hand during headaches.  I have low concern for these.  Abnormal movements can occasionally be seen during migraines.  She will  follow up with yourself.  Lupita Raider Modesto Charon, MD [facility], [place]

## 2012-09-16 ENCOUNTER — Telehealth: Payer: Self-pay | Admitting: Neurology

## 2012-09-16 NOTE — Telephone Encounter (Signed)
The patient is a former patient of Dr. Modesto Charon and is going to be set up to see Dr. Smiley Houseman when the schedule opens up, but she is out of her Verapamil that she was taking to treat her migraines and she is wondering if she could get a 30 day supply just until she sees Dr. Smiley Houseman.  Please call the patient at [phone].

## 2012-09-17 NOTE — Telephone Encounter (Signed)
Left a detailed message re: refill request. Dr. Smiley Houseman not able to accept the [facility] insurance at this time so advised the patient to call her PCP and request the refill and explain the situation to them. We will be happy to see her once the insurance credentialing has been completed. I was not able to give a date and/or time for the completion of this process.

## 2013-10-14 ENCOUNTER — Telehealth: Payer: Self-pay | Admitting: Oncology

## 2013-10-14 NOTE — Telephone Encounter (Signed)
LEFT MESSAGE FOR PATIENT TO RETURN CALL TO SCHEDULE NEW PATIENT APPT.  °

## 2013-10-17 ENCOUNTER — Telehealth: Payer: Self-pay | Admitting: Oncology

## 2013-10-17 NOTE — Telephone Encounter (Signed)
2ND CALL. LEFT MESSAGE FOR PATIENT TO RETURN CALL TO SCHEDULE NEW PATIENT APPT.

## 2014-06-20 ENCOUNTER — Other Ambulatory Visit: Payer: Self-pay | Admitting: Internal Medicine

## 2014-06-20 DIAGNOSIS — R1011 Right upper quadrant pain: Secondary | ICD-10-CM

## 2014-06-21 ENCOUNTER — Other Ambulatory Visit: Payer: 59

## 2015-05-05 ENCOUNTER — Encounter (HOSPITAL_COMMUNITY): Payer: Self-pay | Admitting: Emergency Medicine

## 2015-05-05 ENCOUNTER — Emergency Department (HOSPITAL_COMMUNITY)
Admission: EM | Admit: 2015-05-05 | Discharge: 2015-05-05 | Disposition: A | Discharge status: Home / Self Care | Payer: BLUE CROSS/BLUE SHIELD | Attending: Emergency Medicine | Admitting: Emergency Medicine

## 2015-05-05 DIAGNOSIS — Z87442 Personal history of urinary calculi: Secondary | ICD-10-CM | POA: Insufficient documentation

## 2015-05-05 DIAGNOSIS — Z87891 Personal history of nicotine dependence: Secondary | ICD-10-CM | POA: Diagnosis not present

## 2015-05-05 DIAGNOSIS — Z87440 Personal history of urinary (tract) infections: Secondary | ICD-10-CM | POA: Insufficient documentation

## 2015-05-05 DIAGNOSIS — G43909 Migraine, unspecified, not intractable, without status migrainosus: Secondary | ICD-10-CM | POA: Diagnosis not present

## 2015-05-05 DIAGNOSIS — Z3202 Encounter for pregnancy test, result negative: Secondary | ICD-10-CM | POA: Insufficient documentation

## 2015-05-05 DIAGNOSIS — R197 Diarrhea, unspecified: Secondary | ICD-10-CM | POA: Diagnosis present

## 2015-05-05 DIAGNOSIS — Z79899 Other long term (current) drug therapy: Secondary | ICD-10-CM | POA: Diagnosis not present

## 2015-05-05 DIAGNOSIS — Z87448 Personal history of other diseases of urinary system: Secondary | ICD-10-CM | POA: Diagnosis not present

## 2015-05-05 DIAGNOSIS — F419 Anxiety disorder, unspecified: Secondary | ICD-10-CM | POA: Insufficient documentation

## 2015-05-05 DIAGNOSIS — K625 Hemorrhage of anus and rectum: Secondary | ICD-10-CM | POA: Diagnosis not present

## 2015-05-05 HISTORY — DX: Personal history of urinary (tract) infections: Z87.440

## 2015-05-05 LAB — URINALYSIS, ROUTINE W REFLEX MICROSCOPIC
BILIRUBIN URINE: NEGATIVE
Glucose, UA: NEGATIVE mg/dL
KETONES UR: NEGATIVE mg/dL
Nitrite: NEGATIVE
Protein, ur: NEGATIVE mg/dL
SPECIFIC GRAVITY, URINE: 1.029 (ref 1.005–1.030)
UROBILINOGEN UA: 0.2 mg/dL (ref 0.0–1.0)
pH: 6 (ref 5.0–8.0)

## 2015-05-05 LAB — COMPREHENSIVE METABOLIC PANEL
ALK PHOS: 33 U/L — AB (ref 38–126)
ALT: 14 U/L (ref 14–54)
ANION GAP: 7 (ref 5–15)
AST: 24 U/L (ref 15–41)
Albumin: 4 g/dL (ref 3.5–5.0)
BILIRUBIN TOTAL: 0.3 mg/dL (ref 0.3–1.2)
BUN: 13 mg/dL (ref 6–20)
CALCIUM: 8.9 mg/dL (ref 8.9–10.3)
CO2: 24 mmol/L (ref 22–32)
Chloride: 106 mmol/L (ref 101–111)
Creatinine, Ser: 0.84 mg/dL (ref 0.44–1.00)
GFR calc non Af Amer: >60 mL/min (ref >60)
Glucose, Bld: 90 mg/dL (ref 65–99)
Potassium: 3.4 mmol/L — ABNORMAL LOW (ref 3.5–5.1)
SODIUM: 137 mmol/L (ref 135–145)
TOTAL PROTEIN: 6.7 g/dL (ref 6.5–8.1)

## 2015-05-05 LAB — URINE MICROSCOPIC-ADD ON

## 2015-05-05 LAB — CBC WITH DIFFERENTIAL/PLATELET
BASOS PCT: 0 % (ref 0–1)
Basophils Absolute: 0 10*3/uL (ref 0.0–0.1)
Eosinophils Absolute: 0 10*3/uL (ref 0.0–0.7)
Eosinophils Relative: 1 % (ref 0–5)
HEMATOCRIT: 40.6 % (ref 36.0–46.0)
Hemoglobin: 13.6 g/dL (ref 12.0–15.0)
Lymphocytes Relative: 23 % (ref 12–46)
Lymphs Abs: 1.3 10*3/uL (ref 0.7–4.0)
MCH: 33.2 pg (ref 26.0–34.0)
MCHC: 33.5 g/dL (ref 30.0–36.0)
MCV: 99 fL (ref 78.0–100.0)
MONO ABS: 0.8 10*3/uL (ref 0.1–1.0)
MONOS PCT: 14 % — AB (ref 3–12)
NEUTROS ABS: 3.6 10*3/uL (ref 1.7–7.7)
Neutrophils Relative %: 62 % (ref 43–77)
Platelets: 202 10*3/uL (ref 150–400)
RBC: 4.1 MIL/uL (ref 3.87–5.11)
RDW: 12.4 % (ref 11.5–15.5)
WBC: 5.7 10*3/uL (ref 4.0–10.5)

## 2015-05-05 LAB — POC OCCULT BLOOD, ED: FECAL OCCULT BLD: NEGATIVE

## 2015-05-05 LAB — HCG, SERUM, QUALITATIVE: Preg, Serum: NEGATIVE

## 2015-05-05 LAB — LIPASE, BLOOD: LIPASE: 32 U/L (ref 22–51)

## 2015-05-05 MED ORDER — ONDANSETRON HCL 4 MG PO TABS: 4.0000 mg | ORAL_TABLET | Freq: Three times a day (TID) | ORAL | Status: DC | PRN

## 2015-05-05 MED ORDER — SODIUM CHLORIDE 0.9 % IV BOLUS (SEPSIS)
1000.0000 mL | Freq: Once | INTRAVENOUS | Status: AC
  Administered 2015-05-05: 1000 mL via INTRAVENOUS

## 2015-05-05 MED ORDER — ONDANSETRON HCL 4 MG/2ML IJ SOLN
4.0000 mg | Freq: Once | INTRAMUSCULAR | Status: AC
  Administered 2015-05-05: 4 mg via INTRAVENOUS
  Filled 2015-05-05: qty 2

## 2015-05-05 NOTE — Discharge Instructions (Signed)
Return without fail for worsening symptoms, including vomiting and unable to keep down fluids, persistent fevers, severe abdominal pain, or any other symptoms concerning to you.    Bloody Diarrhea Bloody diarrhea can be caused by many different conditions. Most of the time bloody diarrhea is the result of food poisoning or minor infections. Bloody diarrhea usually improves over 2 to 3 days of rest and fluid replacement. Other conditions that can cause bloody diarrhea include:  Internal bleeding.  Infection.  Diseases of the bowel and colon. Internal bleeding from an ulcer or bowel disease can be severe and requires hospital care or even surgery. DIAGNOSIS  To find out what is wrong your caregiver may check your:  Stool.  Blood.  Results from a test that looks inside the body (endoscopy). TREATMENT   Get plenty of rest.  Drink enough water and fluids to keep your urine clear or pale yellow.  Do not smoke.  Solid foods and dairy products should be avoided until your illness improves.  As you improve, slowly return to a regular diet with easily-digested foods first. Examples are:  Bananas.  Rice.  Toast.  Crackers. You should only need these for about 2 days before adding more normal foods to your diet.  Avoid spicy or fatty foods as well as caffeine and alcohol for several days.  Medicine to control cramping and diarrhea can relieve symptoms but may prolong some cases of bloody diarrhea. Antibiotics can speed recovery from diarrhea due to some bacterial infections. Call your caregiver if diarrhea does not get better in 3 days. SEEK MEDICAL CARE IF:   You do not improve after 3 days.  Your diarrhea improves but your stool appears black. SEEK IMMEDIATE MEDICAL CARE IF:   You become extremely weak or faint.  You become very sweaty.  You have increased pain or bleeding.  You develop repeated vomiting.  You vomit and you see blood or the vomit looks black in  color.  You have a fever. Document Released: 08/25/2005 Document Revised: 11/17/2011 Document Reviewed: 07/27/2009 [facility] Patient Information 2015 [place], [place]. This information is not intended to replace advice given to you by your health care provider. Make sure you discuss any questions you have with your health care provider.

## 2015-05-05 NOTE — ED Provider Notes (Signed)
CSN: [id number]     Arrival date & time 05/05/15  1643 History   First MD Initiated Contact with Patient 05/05/15 1723     Chief Complaint  Patient presents with  . Rectal Bleeding  . Diarrhea     (Consider location/radiation/quality/duration/timing/severity/associated sxs/prior Treatment) HPI 34 year old female  Who presents with diarrhea. She is otherwise healthy with history of frequent UTIs and nephrolithiasis. Reports 3-4 days ago developed generalized fatigue. 2 days ago, she developed increasing fatigue, malaise, generalized myalgias, and nausea with upset stomach. She then began to developed tension headache with chills. That day she developed 9-10 episodes of watery stools. Initially went to urgent care and had flu testing/strep testing and a urinalysis completed. I reports that she was diagnosed with a UTI and started on Bactrim, although she denies any urinary frequency or dysuria or symptoms consistent with prior history of UTI.  She has not had any vomiting, but has noted a decreased by mouth intake and the presence of fever and chills, with Tmax 101.5 two days ago.  1 day ago noticed that she had blood-tinged  Stool with one episode of diarrhea. Denies any recent travel or recent antibiotics. No family history of inflammatory bowel disease.  Past Medical History  Diagnosis Date  . Renal disorder   . Anxiety   . Kidney stones   . Migraines   . History of frequent urinary tract infections    Past Surgical History  Procedure Laterality Date  . Nephrostomy     History reviewed. No pertinent family history. Social History  Substance Use Topics  . Smoking status: Former Smoker    Quit date: 09/09/2003  . Smokeless tobacco: Never Used  . Alcohol Use: Yes     Comment: once a month   OB History    No data available     Review of Systems 10/14 systems reviewed and are negative other than those stated in the HPI  Allergies  Review of patient's allergies indicates no  known allergies.  Home Medications   Prior to Admission medications   Medication Sig Start Date End Date Taking? Authorizing Provider  buPROPion (WELLBUTRIN SR) 150 MG 12 hr tablet Take 150 mg by mouth 3 (three) times daily.  04/29/15  Yes Historical Provider, MD  ibuprofen (ADVIL,MOTRIN) 200 MG tablet Take 600 mg by mouth every 6 (six) hours as needed for fever.   Yes Historical Provider, MD  sulfamethoxazole-trimethoprim (BACTRIM DS,SEPTRA DS) 800-160 MG per tablet Take 1 tablet by mouth 2 (two) times daily. ABT Start Date 05/05/15 & End Date 05/14/15. 05/03/15  Yes Historical Provider, MD  amitriptyline (ELAVIL) 10 MG tablet start with 1 tablet at night for 1 week, then increase to 2 tablets. Patient not taking: Reported on 05/05/2015 01/30/12   Milas Gain, MD  ondansetron (ZOFRAN) 4 MG tablet Take 1 tablet (4 mg total) by mouth every 8 (eight) hours as needed for nausea or vomiting. 05/05/15   Lavera Guise, MD  tiZANidine (ZANAFLEX) 4 MG tablet Take one (4 mg) tablet twice a day by mouth for 7 days. If it makes you too sleepy, take half a tablet (2 mg) twice a day by mouth for 7 days. Patient not taking: Reported on 05/05/2015 12/15/11   Milas Gain, MD  verapamil (CALAN-SR) 120 MG CR tablet Take 1 tablet (120 mg total) by mouth at bedtime. 04/16/12 04/16/13  Milas Gain, MD   BP 107/67 mmHg  Pulse 83  Temp(Src) 98.6 F (  37 C) (Oral)  Resp 20  Ht 5\' 4"  (1.626 m)  Wt 144 lb (65.318 kg)  BMI 24.71 kg/m2  SpO2 100%  LMP 05/05/2015 (Exact Date) Physical Exam Physical Exam  Nursing note and vitals reviewed. Constitutional: appears worn out, but Well developed, well nourished, non-toxic, and in no acute distress Head: Normocephalic and atraumatic.  Mouth/Throat: Oropharynx is clear and appears dry.  Neck: Normal range of motion. Neck supple.  Cardiovascular: Normal rate and regular rhythm.  No edema. Pulmonary/Chest: Effort normal and breath sounds normal.  Abdominal: Soft. Non-distended.  There is no tenderness. There is no rebound and no guarding. No CVA tenderness. Musculoskeletal: Normal range of motion.  Neurological: Alert, no facial droop, fluent speech, moves all extremities symmetrically Skin: Skin is warm and dry.  Psychiatric: Cooperative  ED Course  Procedures (including critical care time) Labs Review Labs Reviewed  CBC WITH DIFFERENTIAL/PLATELET - Abnormal; Notable for the following:    Monocytes Relative 14 (*)    All other components within normal limits  COMPREHENSIVE METABOLIC PANEL - Abnormal; Notable for the following:    Potassium 3.4 (*)    Alkaline Phosphatase 33 (*)    All other components within normal limits  URINALYSIS, ROUTINE W REFLEX MICROSCOPIC (NOT AT [facility]) - Abnormal; Notable for the following:    APPearance CLOUDY (*)    Hgb urine dipstick SMALL (*)    Leukocytes, UA SMALL (*)    All other components within normal limits  URINE MICROSCOPIC-ADD ON - Abnormal; Notable for the following:    Crystals CA OXALATE CRYSTALS (*)    All other components within normal limits  C DIFFICILE QUICK SCREEN W PCR REFLEX  STOOL CULTURE  HCG, SERUM, QUALITATIVE  LIPASE, BLOOD  POC OCCULT BLOOD, ED    I have personally reviewed and evaluated these images and lab results as part of my medical decision-making.    MDM   Final diagnoses:  Diarrhea     in short, this 34 year old female, otherwise healthy, who presents with 2 days of diarrhea, fever, and blood-tinged stool. She is nontoxic in no acute distress on presentation. Her vital signs are within normal limits. She does appear mildly dry on exam. She has a soft and non-tender abdomen. On my suspicion for  Serious or toxic etiology of her symptoms are low at this time, and seems most consistent with likely benign GI illness. Basic blood work reveals unremarkable CBC, comprehensive metabolic profile, lipase, and urinalysis. Rectal exam did not reveal any blood. She is stable hemoglobin, and  remained hemodynamically stable. She is given IV fluids and supportive care. She feels mildly improved, and is able to tolerate oral intake without difficulty. She is felt appropriate for discharge home to continue supportive care. Strict return and follow-up instructions reviewed. She expressed understanding of all discharge instructions and felt comfortable with the plan of care.     Lavera Guise, MD 05/06/15 [phone]

## 2015-05-05 NOTE — ED Notes (Signed)
Pt reports having sudden fatigue/generalized body aches/intermittent chills starting around Thursday. Also c/o headache and "severe diarrhea" around this time. Went to Urgent Care on Thursday- administered flu and strep test. Also did urine testing and told her she had enlarged leukocytes. Given Phenergan and antibiotic (unsure of name). Diagnosed with UTI and sinus infection. Friend reports "she could barely walk and that night her fever got up to 104. I gave her Advil and helped control the fever. The fever came down. The next day she was still complaining of muscle aches." Tmax yesterday was 101 F. Was given generic Bactrim and is still taking this. Friend and patient say the diarrhea "smells different and really bad." Says, "when it hits the cramping is so bad and I saw blood in it yesterday." No other c/c.

## 2015-05-05 NOTE — ED Notes (Signed)
Pt states diarrhea began several hours before starting antibiotics

## 2019-09-05 ENCOUNTER — Ambulatory Visit: Payer: BLUE CROSS/BLUE SHIELD | Attending: Internal Medicine

## 2019-09-05 DIAGNOSIS — U071 COVID-19: Secondary | ICD-10-CM

## 2019-09-07 LAB — NOVEL CORONAVIRUS, NAA: SARS-CoV-2, NAA: NOT DETECTED

## 2019-10-06 DIAGNOSIS — Z0000 Encounter for general adult medical examination without abnormal findings: Secondary | ICD-10-CM | POA: Diagnosis not present

## 2019-10-06 DIAGNOSIS — Z79899 Other long term (current) drug therapy: Secondary | ICD-10-CM | POA: Diagnosis not present

## 2019-10-10 DIAGNOSIS — D7589 Other specified diseases of blood and blood-forming organs: Secondary | ICD-10-CM | POA: Diagnosis not present

## 2019-10-10 DIAGNOSIS — J309 Allergic rhinitis, unspecified: Secondary | ICD-10-CM | POA: Diagnosis not present

## 2019-10-10 DIAGNOSIS — Z1331 Encounter for screening for depression: Secondary | ICD-10-CM | POA: Diagnosis not present

## 2019-10-10 DIAGNOSIS — R519 Headache, unspecified: Secondary | ICD-10-CM | POA: Diagnosis not present

## 2019-10-10 DIAGNOSIS — Z0000 Encounter for general adult medical examination without abnormal findings: Secondary | ICD-10-CM | POA: Diagnosis not present

## 2019-10-10 DIAGNOSIS — R945 Abnormal results of liver function studies: Secondary | ICD-10-CM | POA: Diagnosis not present

## 2019-10-11 DIAGNOSIS — R3121 Asymptomatic microscopic hematuria: Secondary | ICD-10-CM | POA: Diagnosis not present

## 2019-11-07 DIAGNOSIS — Z6825 Body mass index (BMI) 25.0-25.9, adult: Secondary | ICD-10-CM | POA: Diagnosis not present

## 2019-11-07 DIAGNOSIS — L709 Acne, unspecified: Secondary | ICD-10-CM | POA: Diagnosis not present

## 2019-11-07 DIAGNOSIS — Z1231 Encounter for screening mammogram for malignant neoplasm of breast: Secondary | ICD-10-CM | POA: Diagnosis not present

## 2019-11-07 DIAGNOSIS — Z01419 Encounter for gynecological examination (general) (routine) without abnormal findings: Secondary | ICD-10-CM | POA: Diagnosis not present

## 2019-12-15 DIAGNOSIS — Z23 Encounter for immunization: Secondary | ICD-10-CM | POA: Diagnosis not present

## 2019-12-15 DIAGNOSIS — R945 Abnormal results of liver function studies: Secondary | ICD-10-CM | POA: Diagnosis not present

## 2020-03-27 DIAGNOSIS — Z1152 Encounter for screening for COVID-19: Secondary | ICD-10-CM | POA: Diagnosis not present

## 2020-03-27 DIAGNOSIS — J309 Allergic rhinitis, unspecified: Secondary | ICD-10-CM | POA: Diagnosis not present

## 2020-03-27 DIAGNOSIS — J209 Acute bronchitis, unspecified: Secondary | ICD-10-CM | POA: Diagnosis not present

## 2020-09-17 DIAGNOSIS — Z03818 Encounter for observation for suspected exposure to other biological agents ruled out: Secondary | ICD-10-CM | POA: Diagnosis not present

## 2021-02-08 DIAGNOSIS — Z0000 Encounter for general adult medical examination without abnormal findings: Secondary | ICD-10-CM | POA: Diagnosis not present

## 2021-02-11 DIAGNOSIS — R519 Headache, unspecified: Secondary | ICD-10-CM | POA: Diagnosis not present

## 2021-02-11 DIAGNOSIS — F39 Unspecified mood [affective] disorder: Secondary | ICD-10-CM | POA: Diagnosis not present

## 2021-02-11 DIAGNOSIS — R410 Disorientation, unspecified: Secondary | ICD-10-CM | POA: Diagnosis not present

## 2021-02-12 DIAGNOSIS — Z23 Encounter for immunization: Secondary | ICD-10-CM | POA: Diagnosis not present

## 2021-02-12 DIAGNOSIS — Z0000 Encounter for general adult medical examination without abnormal findings: Secondary | ICD-10-CM | POA: Diagnosis not present

## 2021-02-12 DIAGNOSIS — Z1389 Encounter for screening for other disorder: Secondary | ICD-10-CM | POA: Diagnosis not present

## 2021-02-12 DIAGNOSIS — R82998 Other abnormal findings in urine: Secondary | ICD-10-CM | POA: Diagnosis not present

## 2021-02-18 DIAGNOSIS — Z6823 Body mass index (BMI) 23.0-23.9, adult: Secondary | ICD-10-CM | POA: Diagnosis not present

## 2021-02-18 DIAGNOSIS — Z01419 Encounter for gynecological examination (general) (routine) without abnormal findings: Secondary | ICD-10-CM | POA: Diagnosis not present

## 2021-02-18 DIAGNOSIS — Z1231 Encounter for screening mammogram for malignant neoplasm of breast: Secondary | ICD-10-CM | POA: Diagnosis not present

## 2021-02-21 ENCOUNTER — Other Ambulatory Visit: Payer: Self-pay | Admitting: Obstetrics and Gynecology

## 2021-02-21 DIAGNOSIS — R928 Other abnormal and inconclusive findings on diagnostic imaging of breast: Secondary | ICD-10-CM

## 2021-03-23 ENCOUNTER — Ambulatory Visit
Admission: RE | Admit: 2021-03-23 | Discharge: 2021-03-23 | Disposition: A | Discharge status: Home / Self Care | Payer: BLUE CROSS/BLUE SHIELD | Source: Ambulatory Visit | Attending: Obstetrics and Gynecology | Admitting: Obstetrics and Gynecology

## 2021-03-23 ENCOUNTER — Other Ambulatory Visit: Payer: Self-pay

## 2021-03-23 DIAGNOSIS — R928 Other abnormal and inconclusive findings on diagnostic imaging of breast: Secondary | ICD-10-CM

## 2021-04-09 ENCOUNTER — Other Ambulatory Visit: Payer: BLUE CROSS/BLUE SHIELD

## 2021-05-30 DIAGNOSIS — R300 Dysuria: Secondary | ICD-10-CM | POA: Diagnosis not present

## 2021-07-17 DIAGNOSIS — J029 Acute pharyngitis, unspecified: Secondary | ICD-10-CM | POA: Diagnosis not present

## 2021-07-17 DIAGNOSIS — R0981 Nasal congestion: Secondary | ICD-10-CM | POA: Diagnosis not present

## 2021-07-24 DIAGNOSIS — J309 Allergic rhinitis, unspecified: Secondary | ICD-10-CM | POA: Diagnosis not present

## 2021-07-24 DIAGNOSIS — J209 Acute bronchitis, unspecified: Secondary | ICD-10-CM | POA: Diagnosis not present

## 2021-08-28 ENCOUNTER — Other Ambulatory Visit: Payer: Self-pay

## 2021-08-28 ENCOUNTER — Ambulatory Visit (INDEPENDENT_AMBULATORY_CARE_PROVIDER_SITE_OTHER): Payer: BLUE CROSS/BLUE SHIELD | Admitting: Pulmonary Disease

## 2021-08-28 ENCOUNTER — Encounter: Payer: Self-pay | Admitting: Pulmonary Disease

## 2021-08-28 VITALS — BP 118/72 | HR 64 | Ht 64.0 in | Wt 143.6 lb

## 2021-08-28 DIAGNOSIS — J4520 Mild intermittent asthma, uncomplicated: Secondary | ICD-10-CM

## 2021-08-28 NOTE — Progress Notes (Signed)
Synopsis: Referred in December 2022 for acute bronchitis by Rodrigo Ran, MD  Subjective:   PATIENT ID: [name] GENDER: female DOB: November 26, 1980, MRN: [id number]  HPI  Chief Complaint  Patient presents with   Consult    Referred by PCP for chronic bronchitis, possible asthma.    [name] is a 40 year old woman, former smoker who is referred to pulmonary clinic for acute bronchitis.   Patient reports having upper respiratory viral infection towards the end of November in which she had sinus congestion and drainage along with cough, chest congestion and wheezing.  She was treated initially with a prednisone taper and a Z-Pak with some improvement.  Her symptoms returned after finishing the prednisone taper and she was prescribed a second round of steroids.  She reports that she is back to baseline at the current moment.  She does report history of having episodes of bronchitis like this 2-3 times per year.  She does report seasonal allergies in which she will be seeing an allergist in February.  She is currently taking Claritin daily.  She was prescribed Symbicort recently with improvement in her cough and wheezing but this was discontinued due to throat irritation and hoarseness of her voice.  She has increasing cough, chest tightness and sneezing when exposed to strong cleaning agents.  She also experiences symptoms with strong perfume or cologne exposure.  She denies any trouble with her breathing in childhood.  She quit smoking in 2005 and reports secondhand smoke exposure in childhood.  Her grandmother has emphysema and her mother has COPD.  She has an older sister with asthma/bronchitis.  Recent chest radiograph from her primary care physician last month is reported to show signs of hyperinflation.  Past Medical History:  Diagnosis Date   Anxiety    History of frequent urinary tract infections    Kidney stones    Migraines    Renal disorder      No family history on file.    Social History   Socioeconomic History   Marital status: Married    Spouse name: Not on file   Number of children: Not on file   Years of education: Not on file   Highest education level: Not on file  Occupational History   Not on file  Tobacco Use   Smoking status: Former    Types: Cigarettes    Quit date: 09/09/2003    Years since quitting: 17.9   Smokeless tobacco: Never  Substance and Sexual Activity   Alcohol use: Yes    Comment: once a month   Drug use: No   Sexual activity: Yes    Birth control/protection: Pill  Other Topics Concern   Not on file  Social History Narrative   Not on file   Social Determinants of Health   Financial Resource Strain: Not on file  Food Insecurity: Not on file  Transportation Needs: Not on file  Physical Activity: Not on file  Stress: Not on file  Social Connections: Not on file  Intimate Partner Violence: Not on file     No Known Allergies   Outpatient Medications Prior to Visit  Medication Sig Dispense Refill   albuterol (VENTOLIN HFA) 108 (90 Base) MCG/ACT inhaler INHALE 2 PUFFS BY MOUTH EVERY 4 TO 6 HOURS AS NEEDED FOR SHORTNESS OF BREATH OR WHEEZING OR COUGH     budesonide-formoterol (SYMBICORT) 160-4.5 MCG/ACT inhaler Inhale 2 puffs into the lungs 2 (two) times daily as needed.  buPROPion (WELLBUTRIN SR) 150 MG 12 hr tablet Take 150 mg by mouth 3 (three) times daily.      FLUoxetine (PROZAC) 10 MG capsule Take 10 mg by mouth daily.     ibuprofen (ADVIL,MOTRIN) 200 MG tablet Take 600 mg by mouth every 6 (six) hours as needed for fever.     ondansetron (ZOFRAN) 4 MG tablet Take 1 tablet (4 mg total) by mouth every 8 (eight) hours as needed for nausea or vomiting. 12 tablet 0   spironolactone (ALDACTONE) 50 MG tablet Take 50 mg by mouth daily.     sulfamethoxazole-trimethoprim (BACTRIM DS,SEPTRA DS) 800-160 MG per tablet Take 1 tablet by mouth 2 (two) times daily. ABT Start Date 05/05/15 & End Date 05/14/15.     amitriptyline  (ELAVIL) 10 MG tablet start with 1 tablet at night for 1 week, then increase to 2 tablets. (Patient not taking: Reported on 05/05/2015) 60 tablet 3   tiZANidine (ZANAFLEX) 4 MG tablet Take one (4 mg) tablet twice a day by mouth for 7 days. If it makes you too sleepy, take half a tablet (2 mg) twice a day by mouth for 7 days. (Patient not taking: Reported on 05/05/2015) 14 tablet 0   verapamil (CALAN-SR) 120 MG CR tablet Take 1 tablet (120 mg total) by mouth at bedtime. 30 tablet 3   No facility-administered medications prior to visit.    Review of Systems  Constitutional:  Negative for chills, fever, malaise/fatigue and weight loss.  HENT:  Positive for sore throat. Negative for congestion and sinus pain.   Eyes: Negative.   Respiratory:  Positive for cough, shortness of breath and wheezing. Negative for hemoptysis and sputum production.   Cardiovascular:  Negative for chest pain, palpitations, orthopnea, claudication and leg swelling.  Gastrointestinal:  Negative for abdominal pain, heartburn, nausea and vomiting.  Genitourinary: Negative.   Musculoskeletal:  Negative for joint pain and myalgias.  Skin:  Negative for rash.  Neurological:  Positive for headaches. Negative for weakness.  Endo/Heme/Allergies: Negative.   Psychiatric/Behavioral: Negative.     Objective:   Vitals:   08/28/21 0947  BP: 118/72  Pulse: 64  SpO2: 100%  Weight: 143 lb 9.6 oz (65.1 kg)  Height: 5\' 4"  (1.626 m)    Physical Exam Constitutional:      General: She is not in acute distress.    Appearance: She is not ill-appearing.  HENT:     Head: Normocephalic and atraumatic.  Eyes:     General: No scleral icterus.    Conjunctiva/sclera: Conjunctivae normal.     Pupils: Pupils are equal, round, and reactive to light.  Cardiovascular:     Rate and Rhythm: Normal rate and regular rhythm.     Pulses: Normal pulses.     Heart sounds: Normal heart sounds. No murmur heard. Pulmonary:     Effort: Pulmonary  effort is normal.     Breath sounds: Normal breath sounds. No wheezing, rhonchi or rales.  Abdominal:     General: Bowel sounds are normal.     Palpations: Abdomen is soft.  Musculoskeletal:     Right lower leg: No edema.     Left lower leg: No edema.  Lymphadenopathy:     Cervical: No cervical adenopathy.  Skin:    General: Skin is warm and dry.  Neurological:     General: No focal deficit present.     Mental Status: She is alert.  Psychiatric:        Mood and Affect:  Mood normal.        Behavior: Behavior normal.        Thought Content: Thought content normal.        Judgment: Judgment normal.    CBC    Component Value Date/Time   WBC 5.7 05/05/2015 1836   RBC 4.10 05/05/2015 1836   HGB 13.6 05/05/2015 1836   HCT 40.6 05/05/2015 1836   PLT 202 05/05/2015 1836   MCV 99.0 05/05/2015 1836   MCH 33.2 05/05/2015 1836   MCHC 33.5 05/05/2015 1836   RDW 12.4 05/05/2015 1836   LYMPHSABS 1.3 05/05/2015 1836   MONOABS 0.8 05/05/2015 1836   EOSABS 0.0 05/05/2015 1836   BASOSABS 0.0 05/05/2015 1836   Chest imaging: CXR 07/28/21 Concern for hyperinflation.   PFT: No flowsheet data found.  Labs:  Path:  Echo:  Heart Catheterization:  Assessment & Plan:   Mild intermittent reactive airway disease without complication - Plan: Pulmonary Function Test  Discussion: [name] is a 40 year old woman, former smoker who is referred to pulmonary clinic for acute bronchitis.   She has recovered well form her most recent increase in respiratory symptoms with prednisone and antibiotics. Her recurrent episodes of "bronchitis" are concerning for intermittent asthma flares. Given the report of possible hyperinflation from the chest radiograph done at her PCP's office she is to continue on symbicort 160-4.76mcg 2 puffs twice daily. We will provide her with a spacer with the hopes of reducing issues of throat irritation and hoarse voice.   We will check pulmonary function tests at  follow up visit in 6 weeks.  Ana Comas, MD [facility] Office: [phone]   See Amion for personal pager PCCM on call pager [phone] until 7pm. Please call Elink 7p-7a. [phone]     Current Outpatient Medications:    albuterol (VENTOLIN HFA) 108 (90 Base) MCG/ACT inhaler, INHALE 2 PUFFS BY MOUTH EVERY 4 TO 6 HOURS AS NEEDED FOR SHORTNESS OF BREATH OR WHEEZING OR COUGH, Disp: , Rfl:    budesonide-formoterol (SYMBICORT) 160-4.5 MCG/ACT inhaler, Inhale 2 puffs into the lungs 2 (two) times daily as needed., Disp: , Rfl:    buPROPion (WELLBUTRIN SR) 150 MG 12 hr tablet, Take 150 mg by mouth 3 (three) times daily. , Disp: , Rfl:    FLUoxetine (PROZAC) 10 MG capsule, Take 10 mg by mouth daily., Disp: , Rfl:    ibuprofen (ADVIL,MOTRIN) 200 MG tablet, Take 600 mg by mouth every 6 (six) hours as needed for fever., Disp: , Rfl:    ondansetron (ZOFRAN) 4 MG tablet, Take 1 tablet (4 mg total) by mouth every 8 (eight) hours as needed for nausea or vomiting., Disp: 12 tablet, Rfl: 0   spironolactone (ALDACTONE) 50 MG tablet, Take 50 mg by mouth daily., Disp: , Rfl:    sulfamethoxazole-trimethoprim (BACTRIM DS,SEPTRA DS) 800-160 MG per tablet, Take 1 tablet by mouth 2 (two) times daily. ABT Start Date 05/05/15 & End Date 05/14/15., Disp: , Rfl:

## 2021-08-28 NOTE — Patient Instructions (Addendum)
Continue symbicort 160-4.22mcg 2 puffs twice daily  - rinse mouth out  Use symbicort with spacer  Use albuterol 1-2 puffs every 4-6 hours as needed  We will check pulmonary function tests in 6-8 weeks at follow up  Continue daily allergy medicine

## 2021-10-28 ENCOUNTER — Ambulatory Visit: Payer: Self-pay | Admitting: Nurse Practitioner

## 2021-10-28 DIAGNOSIS — H1045 Other chronic allergic conjunctivitis: Secondary | ICD-10-CM | POA: Diagnosis not present

## 2021-10-28 DIAGNOSIS — J3081 Allergic rhinitis due to animal (cat) (dog) hair and dander: Secondary | ICD-10-CM | POA: Diagnosis not present

## 2021-10-28 DIAGNOSIS — J301 Allergic rhinitis due to pollen: Secondary | ICD-10-CM | POA: Diagnosis not present

## 2021-10-28 DIAGNOSIS — J3089 Other allergic rhinitis: Secondary | ICD-10-CM | POA: Diagnosis not present

## 2022-03-07 DIAGNOSIS — Z0000 Encounter for general adult medical examination without abnormal findings: Secondary | ICD-10-CM | POA: Diagnosis not present

## 2022-03-07 DIAGNOSIS — Z1212 Encounter for screening for malignant neoplasm of rectum: Secondary | ICD-10-CM | POA: Diagnosis not present

## 2022-03-14 DIAGNOSIS — E785 Hyperlipidemia, unspecified: Secondary | ICD-10-CM | POA: Diagnosis not present

## 2022-03-14 DIAGNOSIS — F329 Major depressive disorder, single episode, unspecified: Secondary | ICD-10-CM | POA: Diagnosis not present

## 2022-03-14 DIAGNOSIS — E538 Deficiency of other specified B group vitamins: Secondary | ICD-10-CM | POA: Diagnosis not present

## 2022-03-14 DIAGNOSIS — Z1331 Encounter for screening for depression: Secondary | ICD-10-CM | POA: Diagnosis not present

## 2022-03-14 DIAGNOSIS — R5383 Other fatigue: Secondary | ICD-10-CM | POA: Diagnosis not present

## 2022-03-14 DIAGNOSIS — R82998 Other abnormal findings in urine: Secondary | ICD-10-CM | POA: Diagnosis not present

## 2022-03-14 DIAGNOSIS — Z1389 Encounter for screening for other disorder: Secondary | ICD-10-CM | POA: Diagnosis not present

## 2022-03-14 DIAGNOSIS — Z0000 Encounter for general adult medical examination without abnormal findings: Secondary | ICD-10-CM | POA: Diagnosis not present

## 2022-03-21 DIAGNOSIS — Z6824 Body mass index (BMI) 24.0-24.9, adult: Secondary | ICD-10-CM | POA: Diagnosis not present

## 2022-03-21 DIAGNOSIS — Z01419 Encounter for gynecological examination (general) (routine) without abnormal findings: Secondary | ICD-10-CM | POA: Diagnosis not present

## 2022-03-21 DIAGNOSIS — Z1231 Encounter for screening mammogram for malignant neoplasm of breast: Secondary | ICD-10-CM | POA: Diagnosis not present

## 2022-03-25 ENCOUNTER — Other Ambulatory Visit: Payer: Self-pay | Admitting: Obstetrics and Gynecology

## 2022-03-25 DIAGNOSIS — R928 Other abnormal and inconclusive findings on diagnostic imaging of breast: Secondary | ICD-10-CM

## 2022-03-29 ENCOUNTER — Other Ambulatory Visit: Payer: Self-pay | Admitting: Obstetrics and Gynecology

## 2022-03-29 ENCOUNTER — Ambulatory Visit
Admission: RE | Admit: 2022-03-29 | Discharge: 2022-03-29 | Disposition: A | Discharge status: Home / Self Care | Payer: BC Managed Care – PPO | Source: Ambulatory Visit | Attending: Obstetrics and Gynecology | Admitting: Obstetrics and Gynecology

## 2022-03-29 ENCOUNTER — Ambulatory Visit: Admission: RE | Admit: 2022-03-29 | Payer: Self-pay | Source: Ambulatory Visit

## 2022-03-29 DIAGNOSIS — R928 Other abnormal and inconclusive findings on diagnostic imaging of breast: Secondary | ICD-10-CM

## 2022-03-29 DIAGNOSIS — R922 Inconclusive mammogram: Secondary | ICD-10-CM | POA: Diagnosis not present

## 2022-04-08 DIAGNOSIS — N390 Urinary tract infection, site not specified: Secondary | ICD-10-CM | POA: Diagnosis not present

## 2022-04-08 IMAGING — MG MM DIGITAL DIAGNOSTIC UNILAT*L* W/ TOMO W/ CAD
4 series · 4 of 12 positions shown · non-contrast
Comparison: Previous exams.

CLINICAL DATA: Screening recall for possible left breast mass and
asymmetry in the slightly inner left breast.

EXAM:
DIGITAL DIAGNOSTIC UNILATERAL LEFT MAMMOGRAM WITH TOMOSYNTHESIS AND
CAD; ULTRASOUND LEFT BREAST LIMITED
TECHNIQUE: Left digital diagnostic mammography and breast tomosynthesis was
performed. The images were evaluated with computer-aided detection.;
Targeted ultrasound examination of the left breast was performed

[L CC synth-2D]
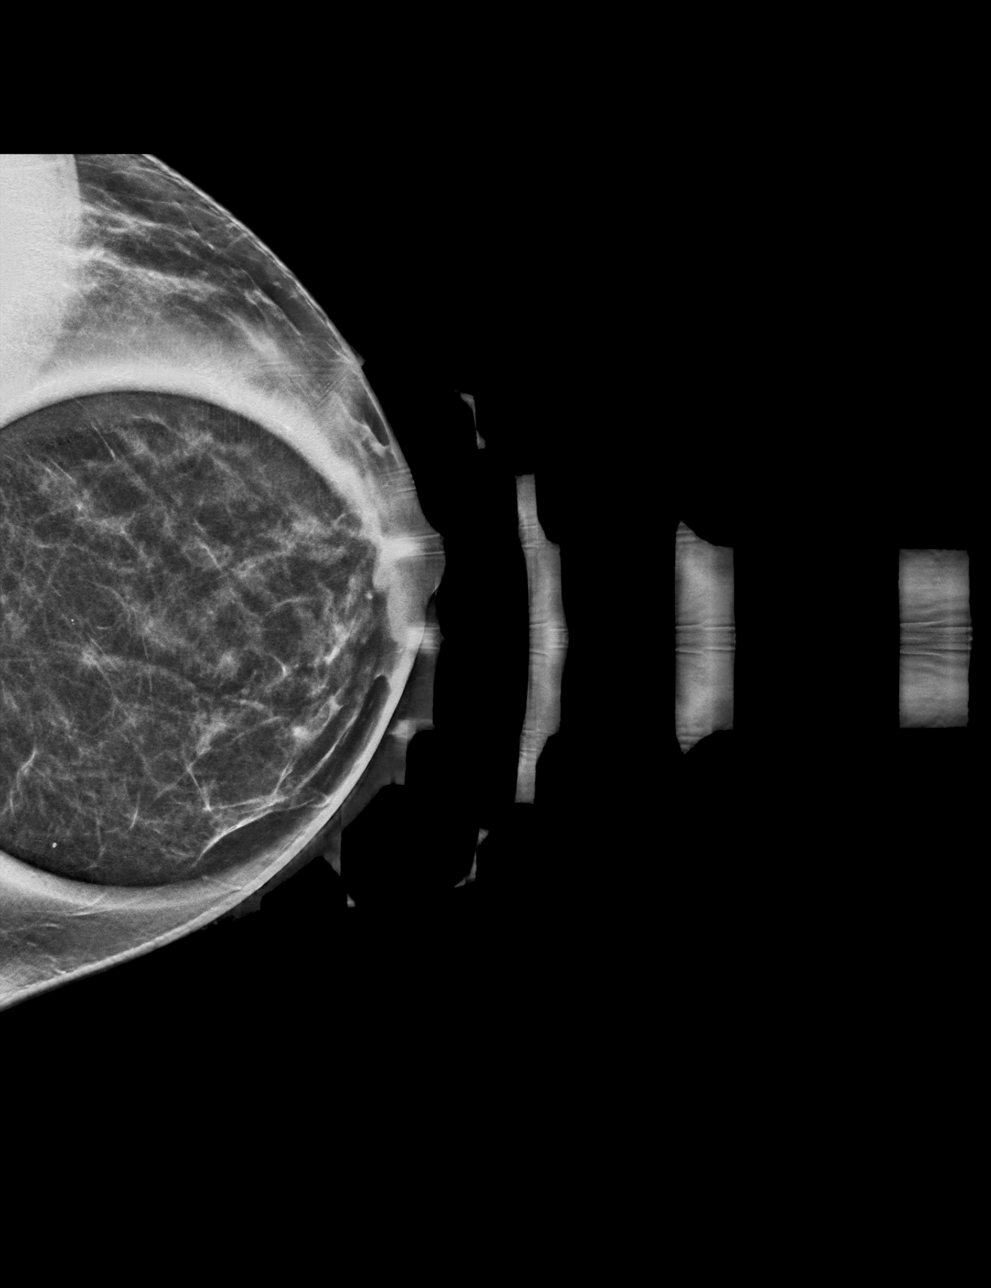

[L ML synth-2D]
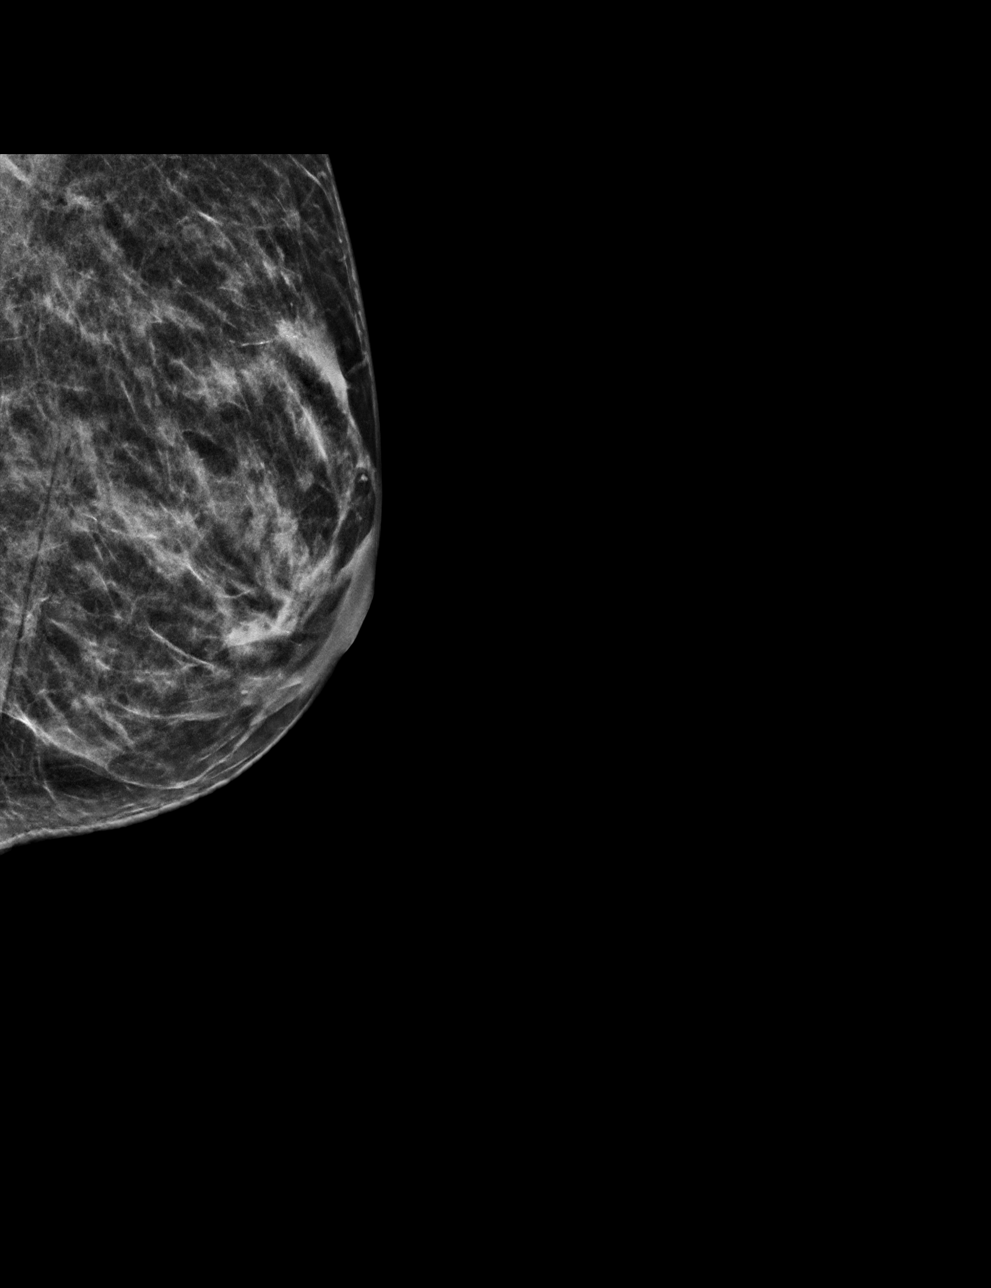

[L ML tomo · tomo slice 25/50.0]
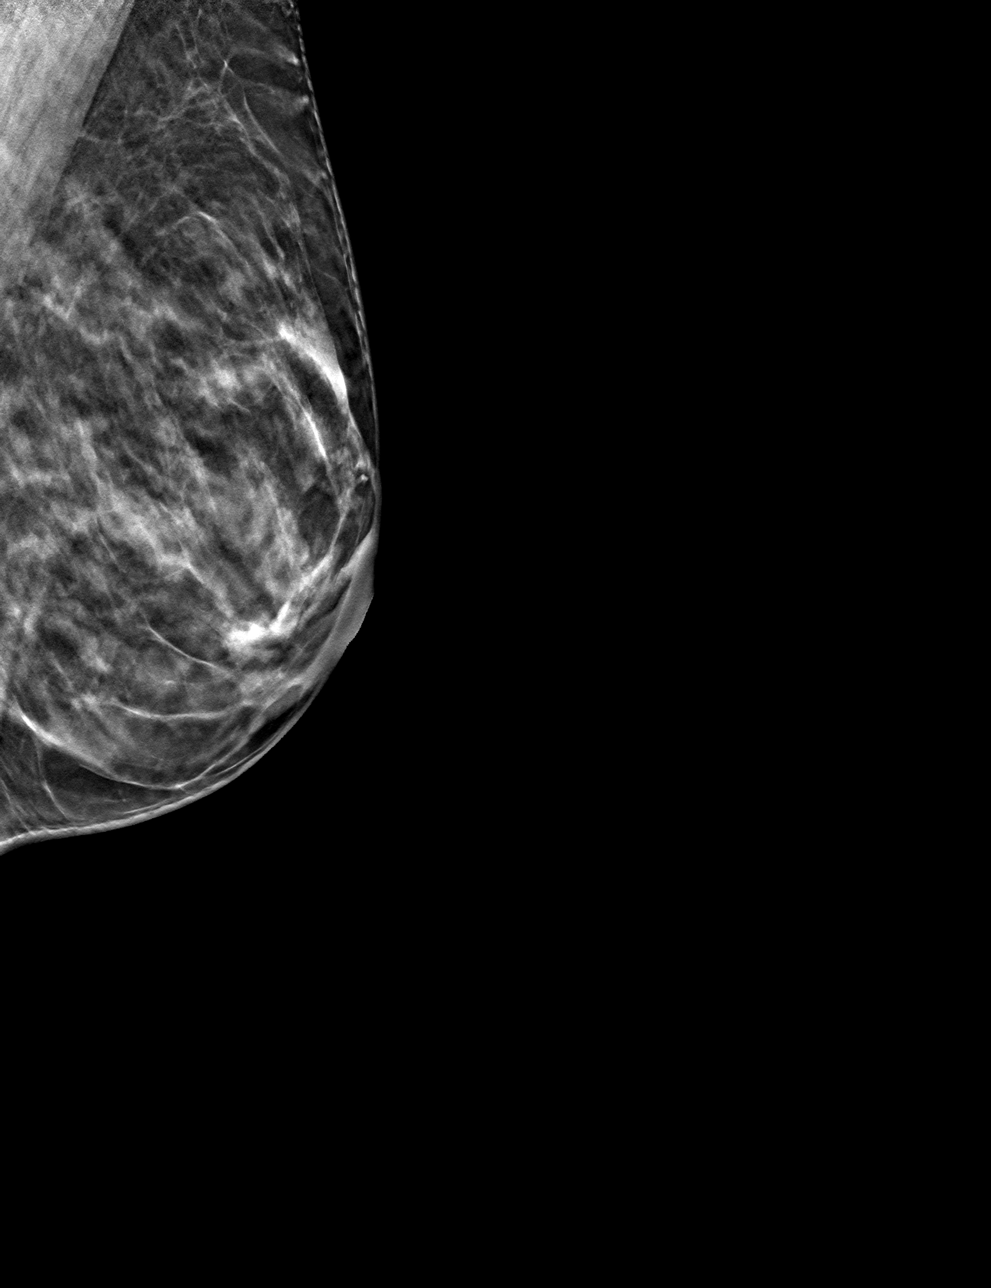

[L CC tomo · tomo slice 25/49.0]
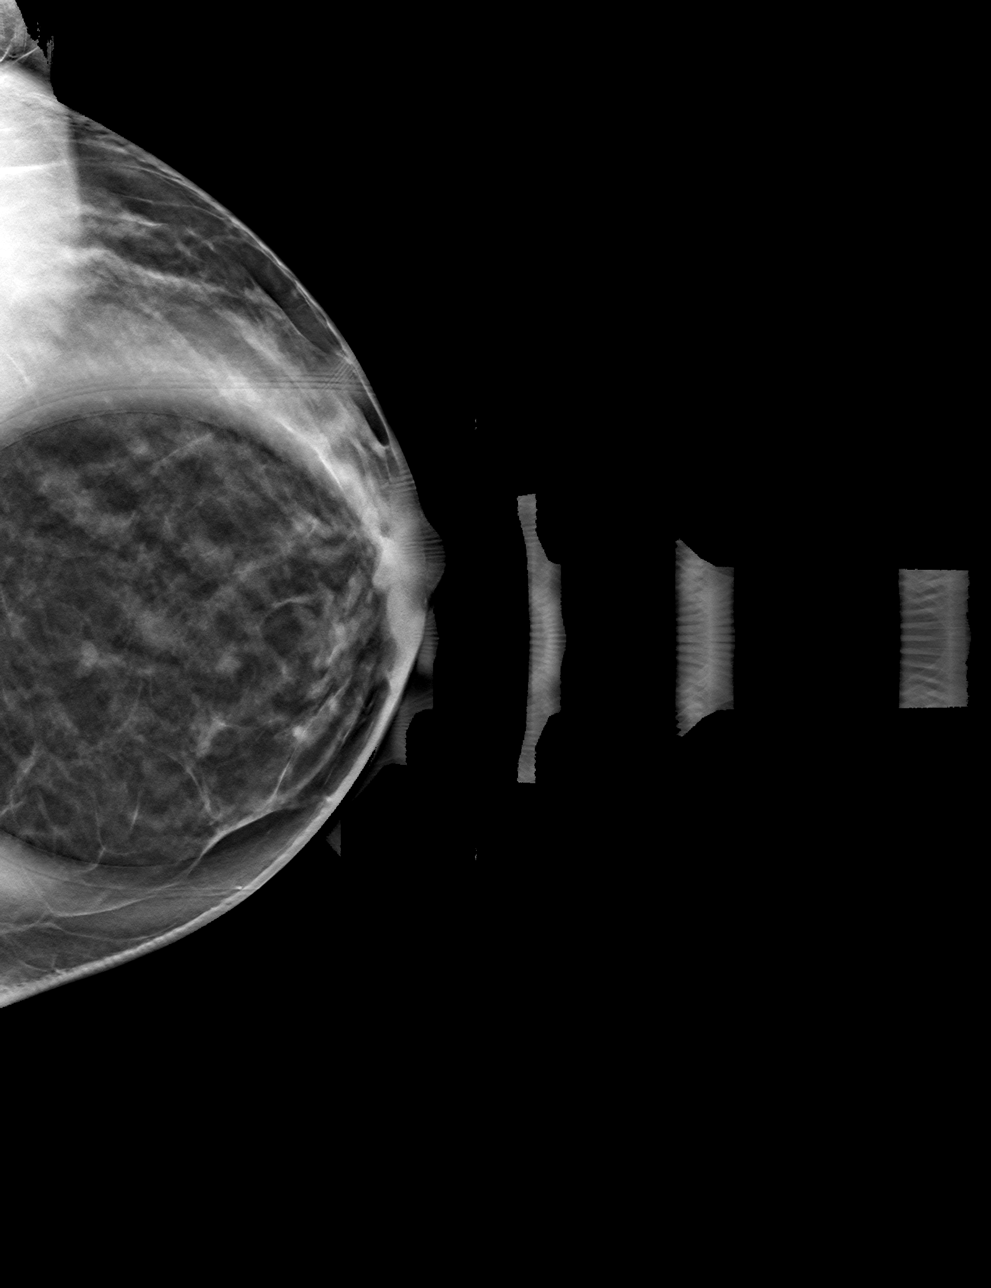

[4 of 12 positions shown; findings below may reference images not displayed]

ACR Breast Density Category c: The breast tissue is heterogeneously
dense, which may obscure small masses.
FINDINGS: Additional tomograms were performed of the left breast. There is an
oval circumscribed mass in the upper slightly inner left breast
posterior depth measuring 0.7 cm.

Targeted ultrasound of the left breast was performed demonstrating
several areas of fibrocystic change. There is a cyst at 11 o'clock 2
cm from nipple measuring 0.8 x 0.3 x 0.4 cm. This is felt to
correspond well with the mass in the upper inner posterior left
breast at mammography. A cluster of cysts in the left breast at 12
o'clock 2 cm from nipple measures 0.7 x 0.4 x 0.2 cm. Additional
smaller scattered cysts are identified in the superior left breast.
IMPRESSION: No findings of malignancy in the left breast.

RECOMMENDATION:
Screening mammogram in one year.(Code:KK-G-FF6)

I have discussed the findings and recommendations with the patient.
If applicable, a reminder letter will be sent to the patient
regarding the next appointment.

BI-RADS CATEGORY  2: Benign.

## 2022-04-08 IMAGING — US US BREAST*L* LIMITED INC AXILLA
1 series · 13 of 15 positions shown · non-contrast
Comparison: Previous exams.

CLINICAL DATA: Screening recall for possible left breast mass and
asymmetry in the slightly inner left breast.

EXAM:
DIGITAL DIAGNOSTIC UNILATERAL LEFT MAMMOGRAM WITH TOMOSYNTHESIS AND
CAD; ULTRASOUND LEFT BREAST LIMITED
TECHNIQUE: Left digital diagnostic mammography and breast tomosynthesis was
performed. The images were evaluated with computer-aided detection.;
Targeted ultrasound examination of the left breast was performed

[Series 1: us breast*left* limited inc axilla · 0.07mm/px · 15 acquisitions, 13 frames shown]
[im 1/15]
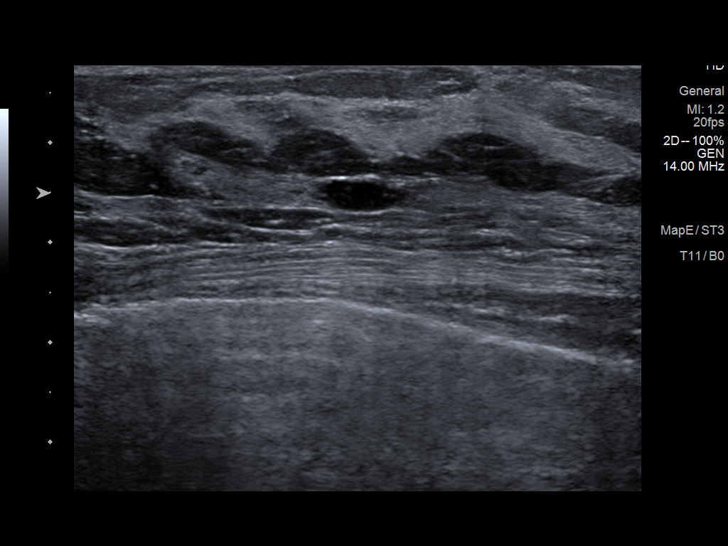
[im 2/15]
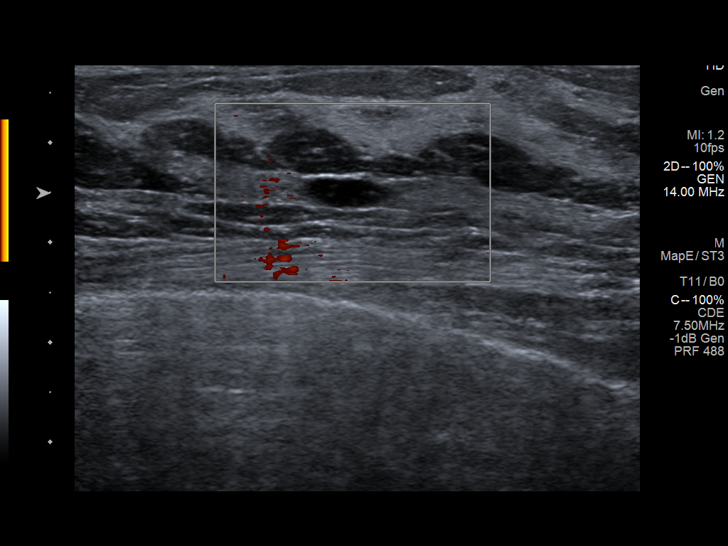
[im 3/15]
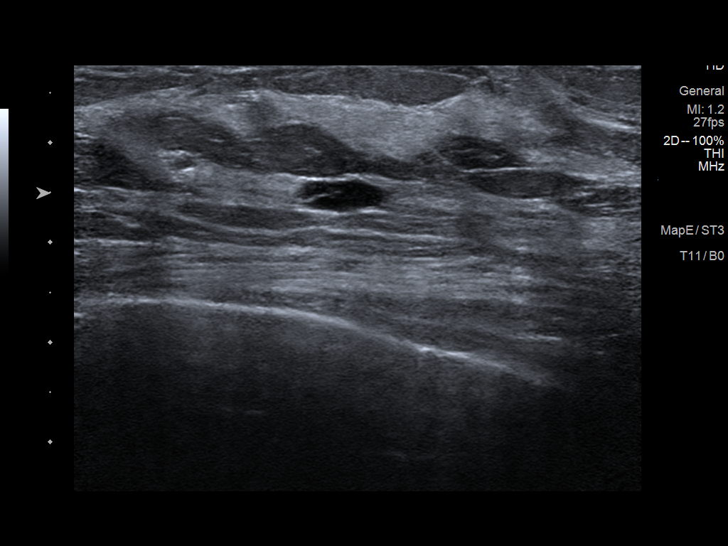
[im 5/15]
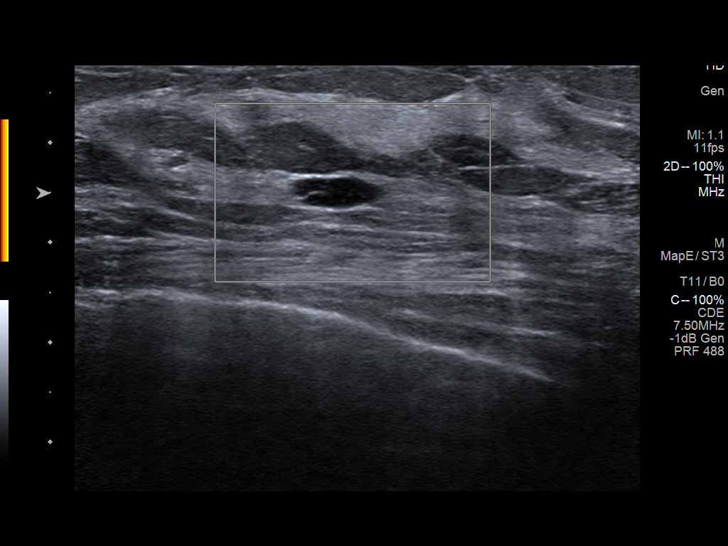
[im 6/15]
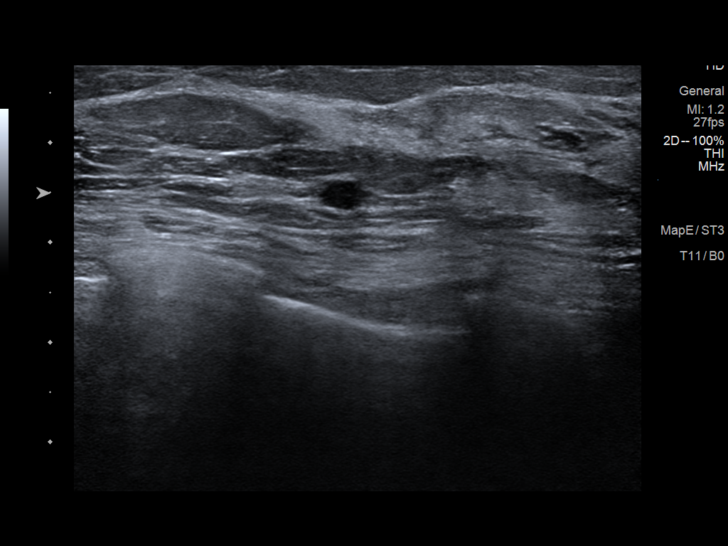
[im 7/15]
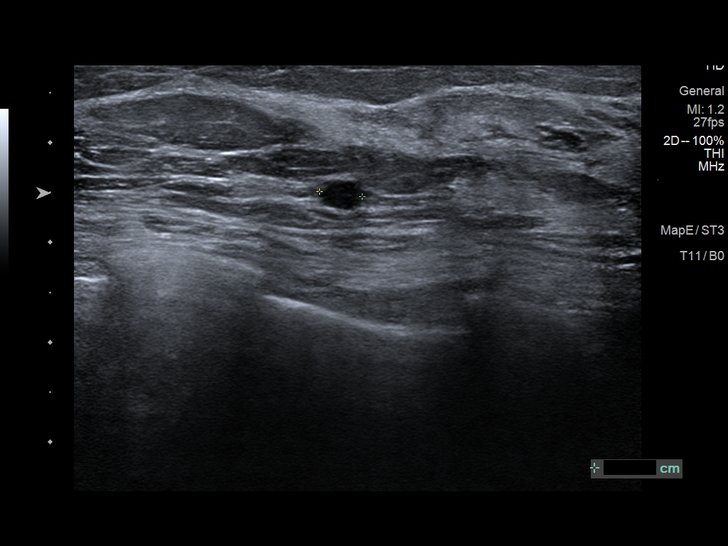
[im 8/15]
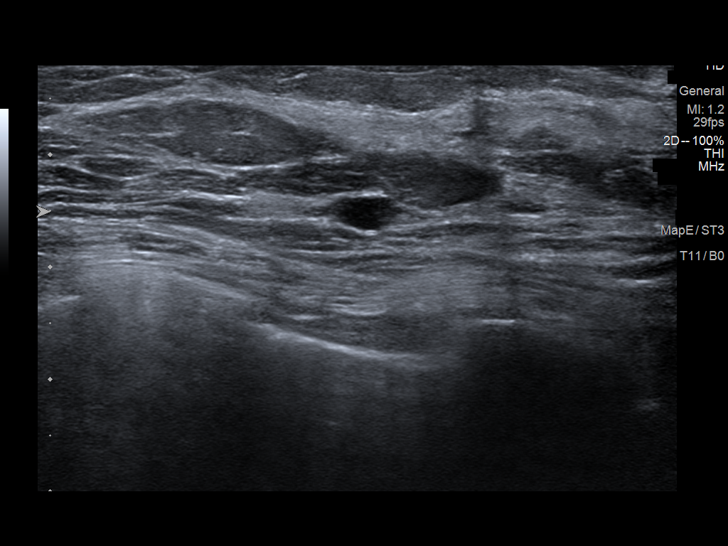
[im 9/15]
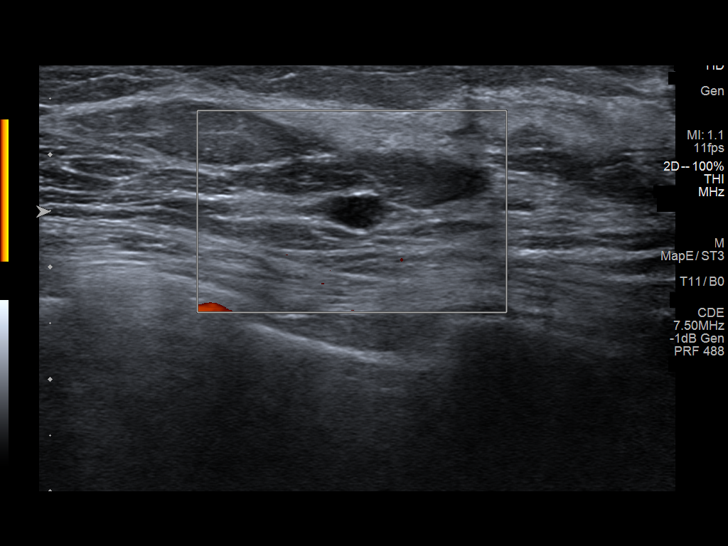
[im 10/15]
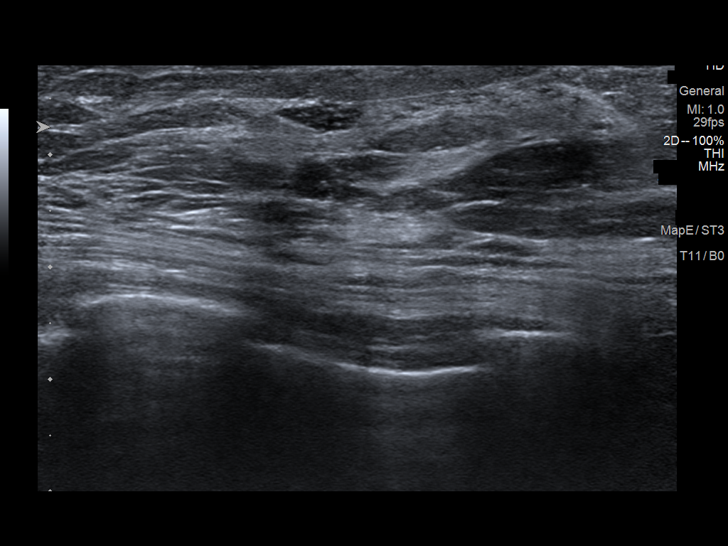
[im 11/15]
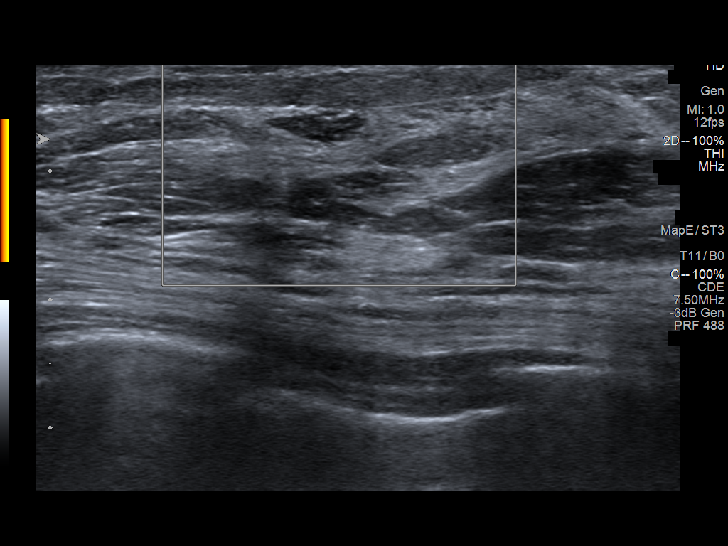
[im 13/15]
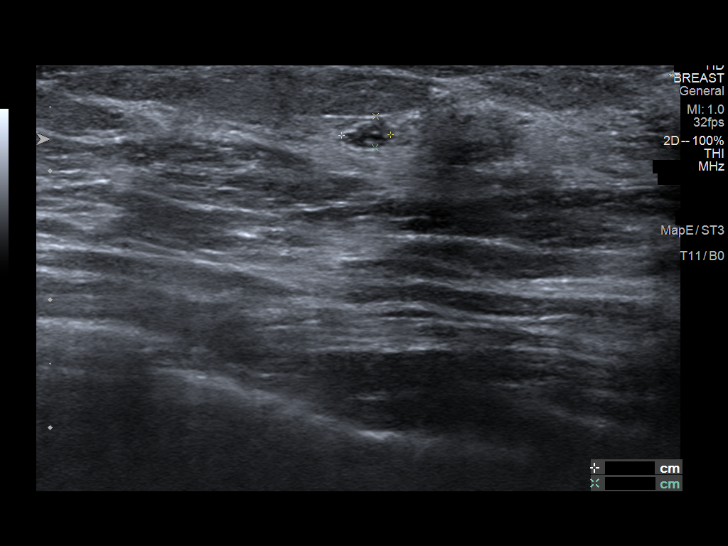
[im 14/15]
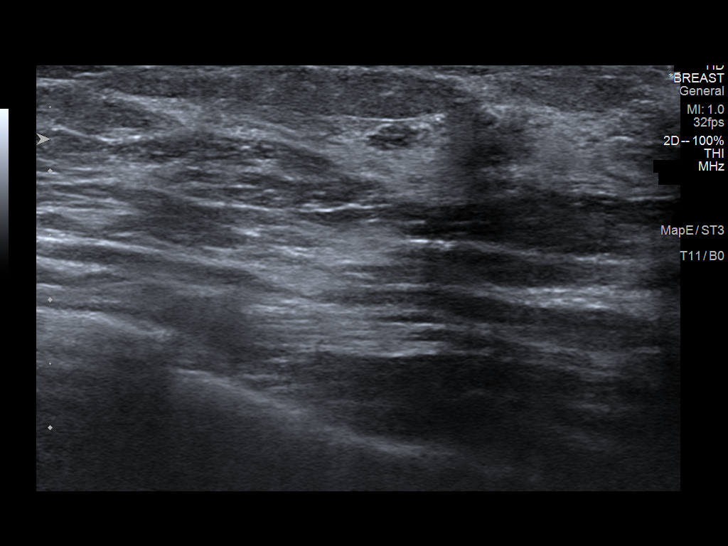
[im 15/15]
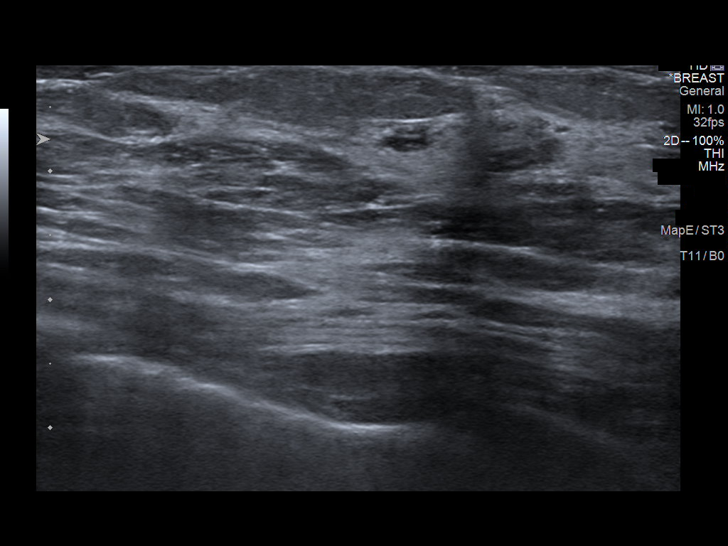

[13 of 15 positions shown; findings below may reference images not displayed]

ACR Breast Density Category c: The breast tissue is heterogeneously
dense, which may obscure small masses.
FINDINGS: Additional tomograms were performed of the left breast. There is an
oval circumscribed mass in the upper slightly inner left breast
posterior depth measuring 0.7 cm.

Targeted ultrasound of the left breast was performed demonstrating
several areas of fibrocystic change. There is a cyst at 11 o'clock 2
cm from nipple measuring 0.8 x 0.3 x 0.4 cm. This is felt to
correspond well with the mass in the upper inner posterior left
breast at mammography. A cluster of cysts in the left breast at 12
o'clock 2 cm from nipple measures 0.7 x 0.4 x 0.2 cm. Additional
smaller scattered cysts are identified in the superior left breast.
IMPRESSION: No findings of malignancy in the left breast.

RECOMMENDATION:
Screening mammogram in one year.(Code:KK-G-FF6)

I have discussed the findings and recommendations with the patient.
If applicable, a reminder letter will be sent to the patient
regarding the next appointment.

BI-RADS CATEGORY  2: Benign.

## 2022-04-21 DIAGNOSIS — N921 Excessive and frequent menstruation with irregular cycle: Secondary | ICD-10-CM | POA: Diagnosis not present

## 2022-04-21 DIAGNOSIS — N841 Polyp of cervix uteri: Secondary | ICD-10-CM | POA: Diagnosis not present

## 2022-05-29 ENCOUNTER — Encounter: Payer: Self-pay | Admitting: *Deleted

## 2022-06-02 ENCOUNTER — Institutional Professional Consult (permissible substitution): Payer: BC Managed Care – PPO | Admitting: Neurology

## 2022-07-18 DIAGNOSIS — Z03818 Encounter for observation for suspected exposure to other biological agents ruled out: Secondary | ICD-10-CM | POA: Diagnosis not present

## 2022-07-18 DIAGNOSIS — J069 Acute upper respiratory infection, unspecified: Secondary | ICD-10-CM | POA: Diagnosis not present

## 2022-10-10 DIAGNOSIS — J019 Acute sinusitis, unspecified: Secondary | ICD-10-CM | POA: Diagnosis not present

## 2023-01-14 DIAGNOSIS — F3341 Major depressive disorder, recurrent, in partial remission: Secondary | ICD-10-CM | POA: Diagnosis not present

## 2023-01-14 DIAGNOSIS — F411 Generalized anxiety disorder: Secondary | ICD-10-CM | POA: Diagnosis not present

## 2023-02-10 DIAGNOSIS — F3342 Major depressive disorder, recurrent, in full remission: Secondary | ICD-10-CM | POA: Diagnosis not present

## 2023-02-10 DIAGNOSIS — F411 Generalized anxiety disorder: Secondary | ICD-10-CM | POA: Diagnosis not present

## 2023-03-06 DIAGNOSIS — M25571 Pain in right ankle and joints of right foot: Secondary | ICD-10-CM | POA: Diagnosis not present

## 2023-03-31 DIAGNOSIS — Z1231 Encounter for screening mammogram for malignant neoplasm of breast: Secondary | ICD-10-CM | POA: Diagnosis not present

## 2023-03-31 DIAGNOSIS — F331 Major depressive disorder, recurrent, moderate: Secondary | ICD-10-CM | POA: Diagnosis not present

## 2023-03-31 DIAGNOSIS — Z1330 Encounter for screening examination for mental health and behavioral disorders, unspecified: Secondary | ICD-10-CM | POA: Diagnosis not present

## 2023-03-31 DIAGNOSIS — Z13220 Encounter for screening for lipoid disorders: Secondary | ICD-10-CM | POA: Diagnosis not present

## 2023-03-31 DIAGNOSIS — Z0000 Encounter for general adult medical examination without abnormal findings: Secondary | ICD-10-CM | POA: Diagnosis not present

## 2023-03-31 DIAGNOSIS — F902 Attention-deficit hyperactivity disorder, combined type: Secondary | ICD-10-CM | POA: Diagnosis not present

## 2023-03-31 DIAGNOSIS — Z136 Encounter for screening for cardiovascular disorders: Secondary | ICD-10-CM | POA: Diagnosis not present

## 2023-04-10 DIAGNOSIS — M25571 Pain in right ankle and joints of right foot: Secondary | ICD-10-CM | POA: Diagnosis not present

## 2023-04-22 DIAGNOSIS — N390 Urinary tract infection, site not specified: Secondary | ICD-10-CM | POA: Diagnosis not present

## 2023-05-18 DIAGNOSIS — F902 Attention-deficit hyperactivity disorder, combined type: Secondary | ICD-10-CM | POA: Diagnosis not present

## 2023-05-18 DIAGNOSIS — L259 Unspecified contact dermatitis, unspecified cause: Secondary | ICD-10-CM | POA: Diagnosis not present

## 2023-08-03 DIAGNOSIS — Z20822 Contact with and (suspected) exposure to covid-19: Secondary | ICD-10-CM | POA: Diagnosis not present

## 2023-08-03 DIAGNOSIS — J069 Acute upper respiratory infection, unspecified: Secondary | ICD-10-CM | POA: Diagnosis not present

## 2023-08-03 DIAGNOSIS — J4521 Mild intermittent asthma with (acute) exacerbation: Secondary | ICD-10-CM | POA: Diagnosis not present

## 2023-08-05 DIAGNOSIS — L710 Perioral dermatitis: Secondary | ICD-10-CM | POA: Diagnosis not present

## 2023-08-24 DIAGNOSIS — H02413 Mechanical ptosis of bilateral eyelids: Secondary | ICD-10-CM | POA: Diagnosis not present

## 2023-08-24 DIAGNOSIS — H0279 Other degenerative disorders of eyelid and periocular area: Secondary | ICD-10-CM | POA: Diagnosis not present

## 2023-08-24 DIAGNOSIS — H02834 Dermatochalasis of left upper eyelid: Secondary | ICD-10-CM | POA: Diagnosis not present

## 2023-08-24 DIAGNOSIS — H02831 Dermatochalasis of right upper eyelid: Secondary | ICD-10-CM | POA: Diagnosis not present

## 2023-08-26 DIAGNOSIS — D225 Melanocytic nevi of trunk: Secondary | ICD-10-CM | POA: Diagnosis not present

## 2023-08-26 DIAGNOSIS — Z129 Encounter for screening for malignant neoplasm, site unspecified: Secondary | ICD-10-CM | POA: Diagnosis not present

## 2023-08-26 DIAGNOSIS — D2262 Melanocytic nevi of left upper limb, including shoulder: Secondary | ICD-10-CM | POA: Diagnosis not present

## 2023-08-26 DIAGNOSIS — D2261 Melanocytic nevi of right upper limb, including shoulder: Secondary | ICD-10-CM | POA: Diagnosis not present

## 2024-05-20 ENCOUNTER — Other Ambulatory Visit: Payer: Self-pay | Admitting: Obstetrics and Gynecology

## 2024-05-20 DIAGNOSIS — R928 Other abnormal and inconclusive findings on diagnostic imaging of breast: Secondary | ICD-10-CM

## 2024-05-31 ENCOUNTER — Ambulatory Visit
Admission: RE | Admit: 2024-05-31 | Discharge: 2024-05-31 | Disposition: A | Discharge status: Home / Self Care | Source: Ambulatory Visit | Attending: Obstetrics and Gynecology | Admitting: Obstetrics and Gynecology

## 2024-05-31 DIAGNOSIS — R928 Other abnormal and inconclusive findings on diagnostic imaging of breast: Secondary | ICD-10-CM

## 2024-06-02 ENCOUNTER — Encounter

## 2024-06-02 ENCOUNTER — Other Ambulatory Visit
